# Patient Record
Sex: Male | Born: 1935 | Race: Black or African American | Hispanic: No | Smoking: Never smoker
Health system: Southern US, Community
[De-identification: ages and names within clinical notes are randomized; demographics above are authoritative.]

## PROBLEM LIST (undated history)

## (undated) DIAGNOSIS — D126 Benign neoplasm of colon, unspecified: Secondary | ICD-10-CM

## (undated) DIAGNOSIS — C801 Malignant (primary) neoplasm, unspecified: Secondary | ICD-10-CM

## (undated) DIAGNOSIS — J45909 Unspecified asthma, uncomplicated: Secondary | ICD-10-CM

## (undated) DIAGNOSIS — I10 Essential (primary) hypertension: Secondary | ICD-10-CM

## (undated) HISTORY — PX: TONSILLECTOMY: SUR1361

## (undated) HISTORY — DX: Benign neoplasm of colon, unspecified: D12.6

---

## 1994-10-07 HISTORY — PX: TUMOR REMOVAL: SHX12

## 2001-09-14 ENCOUNTER — Encounter: Payer: Self-pay | Admitting: Internal Medicine

## 2001-09-14 ENCOUNTER — Encounter: Admission: RE | Admit: 2001-09-14 | Discharge: 2001-09-14 | Payer: Self-pay | Admitting: Internal Medicine

## 2002-06-01 ENCOUNTER — Ambulatory Visit (HOSPITAL_COMMUNITY): Admission: RE | Admit: 2002-06-01 | Discharge: 2002-06-01 | Payer: Self-pay | Admitting: *Deleted

## 2002-06-01 ENCOUNTER — Encounter (INDEPENDENT_AMBULATORY_CARE_PROVIDER_SITE_OTHER): Payer: Self-pay | Admitting: *Deleted

## 2002-06-01 ENCOUNTER — Encounter (INDEPENDENT_AMBULATORY_CARE_PROVIDER_SITE_OTHER): Payer: Self-pay

## 2003-06-03 ENCOUNTER — Emergency Department (HOSPITAL_COMMUNITY): Admission: EM | Admit: 2003-06-03 | Discharge: 2003-06-04 | Payer: Self-pay | Admitting: Emergency Medicine

## 2003-06-03 ENCOUNTER — Encounter: Payer: Self-pay | Admitting: Emergency Medicine

## 2003-06-06 ENCOUNTER — Encounter: Payer: Self-pay | Admitting: Internal Medicine

## 2003-06-06 ENCOUNTER — Inpatient Hospital Stay (HOSPITAL_COMMUNITY): Admission: EM | Admit: 2003-06-06 | Discharge: 2003-06-11 | Payer: Self-pay | Admitting: Internal Medicine

## 2003-06-07 ENCOUNTER — Encounter: Payer: Self-pay | Admitting: Oncology

## 2003-06-09 ENCOUNTER — Encounter: Payer: Self-pay | Admitting: Internal Medicine

## 2004-05-17 ENCOUNTER — Ambulatory Visit (HOSPITAL_COMMUNITY): Admission: RE | Admit: 2004-05-17 | Discharge: 2004-05-17 | Payer: Self-pay | Admitting: *Deleted

## 2004-05-17 ENCOUNTER — Encounter (INDEPENDENT_AMBULATORY_CARE_PROVIDER_SITE_OTHER): Payer: Self-pay | Admitting: *Deleted

## 2004-08-28 ENCOUNTER — Ambulatory Visit: Payer: Self-pay | Admitting: Oncology

## 2005-02-21 ENCOUNTER — Ambulatory Visit: Payer: Self-pay | Admitting: Oncology

## 2006-02-14 ENCOUNTER — Ambulatory Visit: Payer: Self-pay | Admitting: Oncology

## 2006-02-18 LAB — CBC & DIFF AND RETIC
Basophils Absolute: 0 10*3/uL (ref 0.0–0.1)
Eosinophils Absolute: 0.3 10*3/uL (ref 0.0–0.5)
HGB: 13.1 g/dL (ref 13.0–17.1)
IRF: 0.42 — ABNORMAL HIGH (ref 0.070–0.380)
MCV: 103.1 fL — ABNORMAL HIGH (ref 81.6–98.0)
MONO#: 0.3 10*3/uL (ref 0.1–0.9)
MONO%: 9.2 % (ref 0.0–13.0)
NEUT#: 1.6 10*3/uL (ref 1.5–6.5)
RBC: 3.69 10*6/uL — ABNORMAL LOW (ref 4.20–5.71)
RDW: 12.9 % (ref 11.2–14.6)
RETIC #: 69 10*3/uL (ref 31.8–103.9)
Retic %: 1.9 % (ref 0.7–2.3)
WBC: 3.3 10*3/uL — ABNORMAL LOW (ref 4.0–10.0)
lymph#: 1.1 10*3/uL (ref 0.9–3.3)

## 2006-02-18 LAB — MORPHOLOGY
PLT EST: ADEQUATE
RBC Comments: NORMAL

## 2007-02-20 ENCOUNTER — Ambulatory Visit: Payer: Self-pay | Admitting: Oncology

## 2007-02-24 LAB — CBC & DIFF AND RETIC
BASO%: 0.7 % (ref 0.0–2.0)
Basophils Absolute: 0 10*3/uL (ref 0.0–0.1)
EOS%: 7.5 % — ABNORMAL HIGH (ref 0.0–7.0)
IRF: 0.48 — ABNORMAL HIGH (ref 0.070–0.380)
MCH: 36.5 pg — ABNORMAL HIGH (ref 28.0–33.4)
MCHC: 35.7 g/dL (ref 32.0–35.9)
MCV: 102.2 fL — ABNORMAL HIGH (ref 81.6–98.0)
MONO%: 7.7 % (ref 0.0–13.0)
RDW: 13.4 % (ref 11.2–14.6)
RETIC #: 86.6 10*3/uL (ref 31.8–103.9)
lymph#: 1 10*3/uL (ref 0.9–3.3)

## 2007-02-24 LAB — MORPHOLOGY

## 2007-02-25 LAB — ANA: Anti Nuclear Antibody(ANA): NEGATIVE

## 2009-06-13 ENCOUNTER — Ambulatory Visit: Payer: Self-pay | Admitting: Gastroenterology

## 2009-06-13 DIAGNOSIS — J45909 Unspecified asthma, uncomplicated: Secondary | ICD-10-CM | POA: Insufficient documentation

## 2009-06-13 DIAGNOSIS — Z860100 Personal history of colon polyps, unspecified: Secondary | ICD-10-CM | POA: Insufficient documentation

## 2009-06-13 DIAGNOSIS — I119 Hypertensive heart disease without heart failure: Secondary | ICD-10-CM | POA: Insufficient documentation

## 2009-06-20 ENCOUNTER — Ambulatory Visit: Payer: Self-pay | Admitting: Gastroenterology

## 2009-06-20 ENCOUNTER — Encounter: Payer: Self-pay | Admitting: Gastroenterology

## 2009-06-26 ENCOUNTER — Encounter: Payer: Self-pay | Admitting: Gastroenterology

## 2011-02-22 NOTE — H&P (Signed)
NAME:  Rodney Elliott, Rodney Elliott                          ACCOUNT NO.:  [id number]   MEDICAL RECORD NO.:  [id number]                   PATIENT TYPE:  INP   LOCATION:  0374                                 FACILITY:  [facility]   PHYSICIAN:  Erskine Speed, M.D.                DATE OF BIRTH:  [birth year]   DATE OF ADMISSION:  06/06/2003  DATE OF DISCHARGE:                                HISTORY & PHYSICAL   HOSPITAL COURSE:  1. Thrombocytopenia and hemolytic anemia:  Subjective:  This 77 year old patient has been well until August 27 when he  felt weak, slightly short of breath, but had no chest pain.  He had mild  nausea and a low grade fever.  He was having some left abdominal pain and  was seen at [facility] and was found to have a marked  thrombocytopenia with platelets of 29,000 and hemolysis and splenomegaly.  CT scan of the abdomen confirmed enlargement of the spleen, but no other  abnormalities.  The patient had elevated bilirubin also but he was sent home  over the weekend.  Over the weekend he was quite weak, nauseated, and was  cared for by his neighbors.  He was seen in the office on June 06, 2003  and admitted to [facility].  At that time blood sugar was 225.   1. Asthma:  Subjective:  The patient has mild asthma with just occasional use of  inhaler.   1. High blood pressure:  Subjective:  The patient is stable on his usual medications, and on Feb 25, 2003 had his complete physical exam.   1. Colon polyps:  Subjective:  The patient has no current problems relevant to this, and  colonoscopy is due August 2005.   PAST HISTORY:  Unrelated.   ALLERGIES:  None known.   FAMILY HISTORY:  Noncontributory.   HOME MEDICATIONS:  1. Cardura 8 mg daily.  2. Albuterol as needed.  3. Allegra 180 mg one daily.  4. Advair 100/50 one daily.   PHYSICAL EXAMINATION:  VITAL SIGNS:  Temperature 100.9, blood pressure  140/90, respirations 16-20 with mild labor,  pulse 120 and regular.  EKG  showed sinus tachycardia.  HEENT:  Arcus senilis was noted.  NECK:  Normal, supple, full range of motion.  There were no masses.  The  thyroid was negative.  There were no bruits.  CHEST:  Harsh breath sounds.  There are decreased breath sounds in the left  base.  CARDIOVASCULAR:  Rate was tachycardia.  S1 and S2 were normal without  gallop, thrill, heave, rub, or murmur.  ABDOMEN:  Soft and flat.  Bowel sounds were normal.  There was no definable  organomegaly or masses.  There was definite tenderness over the spleen.  There is no CVA pain.  There are no abdominal bruits.  GENITOURINARY:  Negative.  RECTAL:  Done on Feb 25, 2003, and not repeated.  SKIN:  Unremarkable for any change.  JOINT SURVEY:  Unremarkable for any change.  NEUROLOGIC:  Unremarkable for any change.  DISTAL CIRCULATION:  Unremarkable for any change.   ASSESSMENT:  Hemolytic anemia, thrombocytopenia, fever, hyperglycemia,  etiology unknown.   PLAN:  Admit for laboratory evaluation, cultures, hematology consultation.  IV fluids.                                               Erskine Speed, M.D.    Balinda Quails  D:  06/10/2003  T:  06/10/2003  Job:  [id number]   cc:   Genene Churn. Cyndie Chime, M.D.  [address] [facility]  [place]  [place] [address]  Fax: [phone]

## 2011-02-22 NOTE — Op Note (Signed)
NAME:  Rodney Elliott, Rodney Elliott NO.:  [id number]   MEDICAL RECORD NO.:  [id number]                   PATIENT TYPE:  AMB   LOCATION:  ENDO                                 FACILITY:  [facility]   PHYSICIAN:  Georgiana Spinner, M.D.                 DATE OF BIRTH:  02/28/1928   DATE OF PROCEDURE:  DATE OF DISCHARGE:                                 OPERATIVE REPORT   PROCEDURE:  Colonoscopy.   SURGEON:   INDICATIONS FOR PROCEDURE:  Cancer screening.   ANESTHESIA:  Demerol 50, Versed 5 mg.   DESCRIPTION OF PROCEDURE:  With the patient mildly sedated in the left  lateral decubitus position, the Olympus videoscopic colonoscope was inserted  in the rectum and passed under direct vision through the colon to the cecum,  identified __________ cecum and ileocecal valve which were photographed.  After clearing the cecum of fecal debris which looked tenacious which  required a lot of liquid to dilute it.  We were able to suction this out and  withdrew the colonoscope taking circumferential views of the colonic mucosa.  We removed the colonoscope all the way to the rectum which appeared showed  hemorrhoids on retroflexed view.  The endoscope was straightened and  withdrawn.  The patient's vital signs and pulse oximeter remained stable.  The patient tolerated the procedure well without apparent complications.   FINDINGS:  Internal hemorrhoids, otherwise unremarkable examination.  Limited somewhat by the tenacity of this fecal material, although we diluted  and suctioned as best as possible.  A small lesion could be missed but no  gross lesions seen certainly.  Will follow up on this gentleman again in  probably five years.                                               Georgiana Spinner, M.D.    GMO/MEDQ  D:  05/17/2004  T:  05/17/2004  Job:  [id number]   cc:   Erskine Speed, M.D.  [address]  [place]  [place] [address]  Fax: [phone]

## 2011-02-22 NOTE — Consult Note (Signed)
NAME:  Rodney Elliott, Rodney Elliott                          ACCOUNT NO.:  [id number]   MEDICAL RECORD NO.:  [id number]                   PATIENT TYPE:  INP   LOCATION:  0374                                 FACILITY:  [facility]   PHYSICIAN:  Genene Rodney Elliott, M.D.          DATE OF BIRTH:  10-29-1927   DATE OF CONSULTATION:  06/07/2003  DATE OF DISCHARGE:                                   CONSULTATION   This is a hematology consultation to evaluate this man for unexplained  anemia and thrombocytopenia.   This is a pleasant [name] who has been in overall  excellent health except for mild treated hypertension and asthma.  He  initially presented to the emergency department on Friday, August 27 with a  24-hour history of vague left upper quadrant abdominal pain and hematuria.  He was evaluated to rule out kidney stones.  A CT scan of the abdomen and  pelvis was done which showed mild splenomegaly,  no adenopathy and no  evidence for any renal stones.  A lab panel was done on August 27 which  showed hemoglobin of 11.7, MCV 100.2, white count 19,000 with 88%  neutrophils, 6 lymphocytes, 6 monocytes and a platelet count of 29,000.  PT  and PTT were both elevated at 19 and 40 seconds respectively and he is not  on any anticoagulation.  He was told to go home and call his internist on  Monday for further evaluation.   Over the weekend, he continued to have symptoms and became weaker, nauseous  and had dyspnea on exertion.  He did call Dr. Chilton Si Monday morning and he  was promptly evaluated and admitted to the hospital.  Repeat laboratory  studies done on August 30 showed a hemoglobin down to 9.3 and patient's  baseline hemoglobin just recorded in May at time of a yearly physical was 14  grams, MCV 104, platelets 78,000, repeat pro time 14.9, PTT 34 seconds.  Fibrinogen elevated at 693.  D-dimer markedly elevated at 13.5.  Haptoglobin  less than 6.  A Monospot was negative.   Coombs direct and indirect  antibodies were negative.  Serum bilirubin was 3.2.  Repeat value 3.6 and  only 0.9 direct with predominant indirect 2.7.  SGOT mildly elevated at 40.  Remainder of liver functions were normal.   A repeat CBC this morning now shows hemoglobin down to 8.6 and platelet  count of 81,000.   Past history is as noted as above.   MEDICATIONS:  1. Cardura.  2. Advair.   He has taken no new medications recently and has no allergies.  No toxic  exposures.  No infectious exposures.   FAMILY HISTORY:  One male sibling died of complications of rheumatic heart  disease.  Another male sibling had sickle cell trait.  Died in 2004/06/28at  age 16, reasons not known.  Autopsy results pending.   SOCIAL HISTORY:  He is divorced.  He is a retired Paramedic.  He has  lived all over the [place]. He has one daughter who is in her 77s  alive and well.   REVIEW OF SYSTEMS:  He noted a cold sore on his lip about a week ago, but no  associated fever, rash or flu-like symptoms.  No fevers.  No cough.  No  headaches.  No slurred speech.   PHYSICAL EXAMINATION:  GENERAL:  Pleasant well-nourished African-American  man in no distress.  VITAL SIGNS:  Temperatures have been up to 101 degrees.  SKIN, HAIR AND NAILS:  Normal without ecchymosis, petechiae or rash.  HEENT:  Pupils are equal and reactive to light.  There is prominent arcus  senilis.  Pharynx:  No erythema or exudate.  NECK:  Supple. No thyromegaly.  No lymphadenopathy in the neck,  supraclavicular, axillary or inguinal regions.  LUNGS:  Decreased breath sounds at the left base which is dull to  percussion, but no egophony.  CARDIAC:  Regular cardiac rhythm with a 2-3/6 apical systolic murmur.  ABDOMEN:  Soft.  I did not palpate a liver or spleen, but he is tender in  the left upper quadrant.  EXTREMITIES:  No edema.  No calf tenderness.  NEUROLOGIC:  Mental status intact.  Cranial nerves intact.  Motor  strength  5/5.  Reflexes 2+ symmetric.  Coordination is normal.   REVIEW OF PERIPHERAL BLOOD FILM:  There are 2+ spherocytes, but no  schistocytes.  Platelets are confirmed decreased only 1-3 high powered  field.  The neutrophils and lymphocytes appear mature and normal.   SUMMARY AND IMPRESSION:  Otherwise, healthy 77 year old man presents with  acute nonimmune hemolytic anemia with associated thrombocytopenia.  There is  a questionable early infiltrate in the left lower lung, but the patient has  no respiratory symptoms.   In the absence of schistocytes on the peripheral blood film, one cannot make  a diagnosis of TTP.  The only thing that fits the clinical presentation with  initial elevation of his PT and PTT and the D-dimer is an atypical infection  which then provoked a systemic reaction resulting in nonimmune hemolytic  anemia and thrombocytopenia. This could be a viral or other pathogen such as  mycoplasma.   His hematologic profile seems to be stabilizing on its own with a rise in  his platelet count over the last 48 hours.  However, hemoglobin is still  falling.   RECOMMENDATIONS:  I will check mycoplasma titers, toxoplasmosis titers,  Epstein-Barr virus and herpes simplex virus titers.  I would repeat a chest  x-ray.  I would begin him on azithromycin pending results of the above.  I  would continue monitor daily CBCs and retic counts along with LDH to assess  for ongoing hemolysis.  Despite the fact that the Coombs test was negative,  I would still consider a trial of steroids if hemoglobin continues to fall.   Thank you for this consultation.  I will follow with interest with you.                                               Genene Rodney Elliott, M.D.    Lottie Rater  D:  06/07/2003  T:  06/07/2003  Job:  [id number]   cc:   Erskine Speed, M.D.  [address]  [address]  [place]  [place] [address]  Fax: [phone]

## 2011-02-22 NOTE — Discharge Summary (Signed)
NAME:  Rodney Elliott, Rodney Elliott                          ACCOUNT NO.:  [id number]   MEDICAL RECORD NO.:  [id number]                   PATIENT TYPE:  INP   LOCATION:  0374                                 FACILITY:  [facility]   PHYSICIAN:  Erskine Speed, M.D.                DATE OF BIRTH:  [birth year]   DATE OF ADMISSION:  06/06/2003  DATE OF DISCHARGE:  06/11/2003                                 DISCHARGE SUMMARY   PROBLEM LIST:  1. Thrombocytopenia.  2. Hemolytic anemia.  3. Fever.   SUBJECTIVE:  This 77 year old patient has generally been in good health and  has been treated for high blood pressure and asthma.  He became ill with  fever, weakness, marked thrombocytopenia with platelet count of 29,000,  hemolysis, and splenomegaly, and was admitted to the hospital on June 06, 2003 for further evaluation.  On admission to the hospital, extensive  laboratory testing was done (see lab cumulative) without an initial  diagnosis.  Because of the patient's fever, cultures were obtained from  multiple sites.  The patient's hemoglobin continued to fall from its normal  range down to 8.6 with a white count of 11,600.  Platelets increased in the  first 24 hours up to 81,000.  Accu-Cheks were still slightly high, but  hemoglobin A1C was 4.3, and the patient had no previous history of diabetes.  A haptoglobin test was less than 6.  Coombs test was negative.  The patient  was evaluated for DIC, and it was felt not to be the case.  On June 07, 2003, the patient was seen by Dr. Riley Churches in consultation.  In  addition to the previously-ordered studies, other tests for viral and  certain bacterial infections were ordered by Dr. Cyndie Chime.  By June 08, 2003, the patient was still having some slight nausea, the spleen was  still slightly tender, temperature was 100.8 over the prior 24 hours.  Bilirubin was 3.9.  Liver function tests were now normal.  LDH continued to  be elevated at 793.   Screens for toxoplasmosis and other infections were all  negative.  At that point, the patient had a hemolytic anemia, etiology  unknown, and was on IV antibiotics (Zithromax).  He was also evaluated for  HIV and Epstein-Barr virus.  By June 09, 2003, the platelet count was up  to 166,000, and hemoglobin was leveling at 8.7.  Bilirubin was down from 3.9  to 1.5.  LDH continued to be over 700.  By June 10, 2003, the patient  felt great.  He was tolerating a regular diet.  Hemoglobin was stable at  8.8, white count was 11,700, and other laboratories were basically  unchanged.  Chest x-ray had shown improvement from its initial minor  irregularities.  The patient was changed from IV to p.o. antibiotics.  By  June 11, 2003, the patient felt great, had ambulated  in the hall all  day, and had remained afebrile and was ready for discharge.   DISCHARGE DIAGNOSIS:  Hemolytic anemia, etiology unknown.   DISCHARGE CONDITION:  Improved.   CONSULTATIONS:  Dr. Riley Churches.   PROCEDURES:  None.   COMPLICATIONS:  None.   DISCHARGE MEDICATIONS:  1. Folic acid 1 mg twice day.  2. Cardura 8 mg one daily.  3. Potassium chloride one daily.  4. Albuterol as needed.  5. Advair as needed.  6. Allegra as needed.  7. Pain management - Tylenol only.   ACTIVITY:  As tolerated, but try to take it easy.   DIET:  No restrictions.   WOUND CARE:  Not applicable.   SPECIAL INSTRUCTIONS:  1. See Dr. Erskine Speed in one month.  2. Call Dr. Riley Churches for appointment to be scheduled within the next     week or two for further laboratory follow up.                                               Erskine Speed, M.D.    Balinda Quails  D:  07/07/2003  T:  07/07/2003  Job:  [id number]   cc:   Genene Churn. Cyndie Chime, M.D.  [address] [facility]  [place]  [place] [address]  Fax: [phone]

## 2011-02-22 NOTE — Op Note (Signed)
   NAME:  Rodney Elliott, Rodney Elliott                          ACCOUNT NO.:  [id number]   MEDICAL RECORD NO.:  [id number]                   PATIENT TYPE:  AMB   LOCATION:  ENDO                                 FACILITY:  [facility]   PHYSICIAN:  Georgiana Spinner, M.D.                 DATE OF BIRTH:  08-07-1928   DATE OF PROCEDURE:  06/01/2002  DATE OF DISCHARGE:                                 OPERATIVE REPORT   PROCEDURE:  Colonoscopy with polypectomy.   INDICATIONS:  Colon polyp.  Colon cancer screening.   ANESTHESIA:  Demerol 70 and Versed 7 mg.   PROCEDURE:  With the patient mildly sedated in the left lateral decubitus  position, a rectal examination was performed and he had hemorrhoidal tags  noted.  The prostate was normal, otherwise unremarkable rectal examination.  Subsequently the Olympus videoscopic colonoscope was inserted into the  rectum and passed under direct vision into the cecum, identified by the  ileocecal valve and appendiceal orifice.  The prep was Viscol tablets and  the preparation was good.  From this point the colonoscope was slowly  withdrawn taking circumferential views of the entire colonic mucosa,  stopping in the sigmoid colon approximately 40 cm from the anal verge, at  which point a small polyp was seen.  It was photographed and it was removed  using snare cautery technique on a setting of 20/20 blended current.  It was  approximately 3 to 4 mm in size.  The endoscope was then withdrawn all the  way to the rectum which appeared normal on direct and showed hemorrhoids on  retroflex view.  The endoscope was straightened and withdrawn.  The patient  vital signs and pulse oximeter remained stable.  The patient tolerated the  procedure well without apparent complications.   FINDINGS:  1. Occasional diverticulum of the sigmoid colon.  2. Small polyp at 40 cm, removed by snare cautery technique.  3. Internal hemorrhoids.  4. Otherwise an unremarkable colonoscopic  examination.   PLAN:  Await biopsy report of the polyp.  The patient will call me for  results and followup with me as needed as an outpatient.                                               Georgiana Spinner, M.D.    GMO/MEDQ  D:  06/01/2002  T:  06/03/2002  Job:  [id number]   cc:   Kingsley Spittle, M.D.

## 2014-04-04 ENCOUNTER — Encounter: Payer: Self-pay | Admitting: *Deleted

## 2014-04-15 ENCOUNTER — Encounter: Payer: Self-pay | Admitting: Gastroenterology

## 2015-10-24 ENCOUNTER — Other Ambulatory Visit: Payer: Self-pay | Admitting: Internal Medicine

## 2015-10-24 DIAGNOSIS — N2889 Other specified disorders of kidney and ureter: Secondary | ICD-10-CM

## 2015-10-26 ENCOUNTER — Ambulatory Visit
Admission: RE | Admit: 2015-10-26 | Discharge: 2015-10-26 | Disposition: A | Discharge status: Home / Self Care | Payer: Medicare Other | Source: Ambulatory Visit | Attending: Internal Medicine | Admitting: Internal Medicine

## 2015-10-26 DIAGNOSIS — N2889 Other specified disorders of kidney and ureter: Secondary | ICD-10-CM

## 2016-02-26 ENCOUNTER — Encounter: Payer: Self-pay | Admitting: Gastroenterology

## 2016-03-25 ENCOUNTER — Telehealth: Payer: Self-pay | Admitting: Oncology

## 2016-03-25 ENCOUNTER — Encounter: Payer: Self-pay | Admitting: Oncology

## 2016-03-25 NOTE — Telephone Encounter (Signed)
Contacted pt regarding hem appt for 04/12/16. Completed intake, mailed new pt letter.

## 2016-04-12 ENCOUNTER — Other Ambulatory Visit: Payer: Self-pay | Admitting: *Deleted

## 2016-04-12 ENCOUNTER — Ambulatory Visit (HOSPITAL_BASED_OUTPATIENT_CLINIC_OR_DEPARTMENT_OTHER): Payer: Medicare Other | Admitting: Oncology

## 2016-04-12 ENCOUNTER — Ambulatory Visit (HOSPITAL_BASED_OUTPATIENT_CLINIC_OR_DEPARTMENT_OTHER): Payer: Medicare Other

## 2016-04-12 VITALS — BP 145/83 | HR 73 | Temp 97.8°F | Resp 18 | Ht 68.0 in | Wt 162.5 lb

## 2016-04-12 DIAGNOSIS — D472 Monoclonal gammopathy: Secondary | ICD-10-CM

## 2016-04-12 DIAGNOSIS — D72819 Decreased white blood cell count, unspecified: Secondary | ICD-10-CM | POA: Diagnosis not present

## 2016-04-12 DIAGNOSIS — N289 Disorder of kidney and ureter, unspecified: Secondary | ICD-10-CM

## 2016-04-12 DIAGNOSIS — D649 Anemia, unspecified: Secondary | ICD-10-CM

## 2016-04-12 LAB — CBC WITH DIFFERENTIAL/PLATELET
BASO%: 0.8 % (ref 0.0–2.0)
Basophils Absolute: 0 10*3/uL (ref 0.0–0.1)
EOS%: 3.6 % (ref 0.0–7.0)
Eosinophils Absolute: 0.2 10*3/uL (ref 0.0–0.5)
HCT: 33.2 % — ABNORMAL LOW (ref 38.4–49.9)
HGB: 10.9 g/dL — ABNORMAL LOW (ref 13.0–17.1)
LYMPH#: 1 10*3/uL (ref 0.9–3.3)
LYMPH%: 23.7 % (ref 14.0–49.0)
MCH: 34.6 pg — ABNORMAL HIGH (ref 27.2–33.4)
MCHC: 32.8 g/dL (ref 32.0–36.0)
MCV: 105.4 fL — ABNORMAL HIGH (ref 79.3–98.0)
MONO#: 0.2 10*3/uL (ref 0.1–0.9)
MONO%: 5.7 % (ref 0.0–14.0)
NEUT%: 66.2 % (ref 39.0–75.0)
NEUTROS ABS: 2.8 10*3/uL (ref 1.5–6.5)
PLATELETS: 207 10*3/uL (ref 140–400)
RBC: 3.14 10*6/uL — AB (ref 4.20–5.82)
RDW: 13.2 % (ref 11.0–14.6)
WBC: 4.3 10*3/uL (ref 4.0–10.3)

## 2016-04-12 LAB — COMPREHENSIVE METABOLIC PANEL
ALT: 16 U/L (ref 0–55)
ANION GAP: 7 meq/L (ref 3–11)
AST: 21 U/L (ref 5–34)
Albumin: 3.8 g/dL (ref 3.5–5.0)
Alkaline Phosphatase: 75 U/L (ref 40–150)
BILIRUBIN TOTAL: 0.66 mg/dL (ref 0.20–1.20)
BUN: 10.5 mg/dL (ref 7.0–26.0)
CO2: 22 meq/L (ref 22–29)
CREATININE: 2.2 mg/dL — AB (ref 0.7–1.3)
Calcium: 9.9 mg/dL (ref 8.4–10.4)
Chloride: 113 mEq/L — ABNORMAL HIGH (ref 98–109)
EGFR: 31 mL/min/{1.73_m2} — ABNORMAL LOW (ref >90)
GLUCOSE: 120 mg/dL (ref 70–140)
Potassium: 4.9 mEq/L (ref 3.5–5.1)
Sodium: 142 mEq/L (ref 136–145)
TOTAL PROTEIN: 7.3 g/dL (ref 6.4–8.3)

## 2016-04-12 LAB — RETICULOCYTES (CHCC)
Immature Retic Fract: 10.3 % (ref 3.00–10.60)
RBC: 3.09 10*6/uL — ABNORMAL LOW (ref 4.20–5.82)
Retic %: 2.87 % — ABNORMAL HIGH (ref 0.80–1.80)
Retic Ct Abs: 88.68 10*3/uL (ref 34.80–93.90)

## 2016-04-12 LAB — CHCC SMEAR

## 2016-04-12 NOTE — Progress Notes (Signed)
[facility] New Patient Consult   Referring MD: Levin Erp, Md [address], [place], [place] [address]   Rodney Elliott 82 y.o.  1928/06/15    Reason for Referral: Renal insufficiency, abnormal serum protein, anemia   HPI: Rodney Elliott is followed by Dr. Nyoka Cowden for internal medicine care. He has a history of hypertension and remote hemolytic anemia. He saw Dr. Nyoka Cowden on 03/06/2016. A CBC found hemoglobin 11.6, MCV 103.9, white count 3.3, ANC 1.9. The creatinine returned at 2.2 with a total protein of 7, albumin 4.1, and calcium of 9.8. The erythrocyte sedimentation rate returned at 5. A serum protein electrophoresis revealed a 0.6 g/dL abnormal protein band. 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  Rodney Elliott reports feeling well. No complaints.  On 10/19/2015 the hemoglobin returned at 11.1, MCV 105.7, platelets 150,000, white count 3.6 with an ANC of 1.9. The creatinine returned at 1.91 in September 2016.    Past Medical History  Diagnosis Date  . Tubular adenoma of colon     2010    .   Hypertension   .   Asthma   .   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   Mild chronic leukopenia  Past surgical history: Removal of a benign lesion from the left neck in 1996   Medications: Reviewed  Allergies: No Known Allergies  Family history: No family history of cancer. He has one daughter and had 2 brothers.  Social History:   He lives alone in [place]. He is retired from the [employer]. He does not use cigarettes. He drinks alcohol occasionally. No transfusion history. No risk factor for HIV or hepatitis.    ROS:   Positives include: None  A complete ROS was otherwise negative.  Physical Exam:  Blood pressure 145/83, pulse 73, temperature 97.8 F (36.6 C), temperature source Oral, resp. rate 18, height '5\' 8"'  (1.727 m), weight 162 lb 8 oz (73.71 kg), SpO2 100 %.  HEENT: Oropharynx without  visible mass, neck without mass Lungs: Clear bilaterally, no respiratory distress Cardiac: Regular rate and rhythm Abdomen: No hepatosplenomegaly, nontender, no mass, reducible left inguinal hernia GU: Uncircumcised male, testes without mass  Vascular: No leg edema Lymph nodes: No cervical, supraclavicular, axillary, or inguinal nodes Neurologic: Alert and oriented, the motor exam appears intact in the upper and lower extremities Skin: No rash   LAB:  CBC  Lab Results  Component Value Date   WBC 4.3 04/12/2016   HGB 10.9* 04/12/2016   HCT 33.2* 04/12/2016   MCV 105.4* 04/12/2016   PLT 207 04/12/2016   NEUTROABS 2.8 04/12/2016    Blood smear: The platelets are normal in number, no platelet clumps. I saw one 5 lobed neutrophil. No blasts. A few ovalocytes and teardrops. The polychromasia is not increased. No spherocytes.   Assessment/Plan:   1. Macrocytic anemia  2. Renal insufficiency  3.   Serum monoclonal IgG kappa protein  4.    Hypertension  5.    Idiopathic nonimmune hemolytic anemia/thrombocytopenia in 2014, spontaneously resolved   Disposition:   Rodney Elliott is referred for hematology evaluation with a recently discovered serum monoclonal protein. He has a history of chronic macrocytic anemia and renal insufficiency. He has a remote history of hemolytic anemia, but the blood smear today is not suggestive of ongoing hemolysis.  I have a low clinical suspicion for multiple myeloma. We will obtain quantitative immunoglobulin levels and a serum free light chain analysis. We will  proceed with additional diagnostic evaluation such as a metastatic bone survey or bone marrow biopsy pending these results.  The macrocytic anemia may be related to myelodysplasia. We will obtain a reticulocyte count and haptoglobin level to look for evidence of ongoing hemolysis. The anemia may be in part related to chronic renal insufficiency. I will check a vitamin B 12 level.  We will ask  Rodney Elliott to return for an office visit and repeat CBC in 3-4 months. We will see him sooner pending the above laboratory evaluation.    Betsy Coder, MD  04/12/2016, 10:52 AM

## 2016-04-13 LAB — HAPTOGLOBIN: Haptoglobin: <10 mg/dL — ABNORMAL LOW (ref 34–200)

## 2016-04-15 ENCOUNTER — Telehealth: Payer: Self-pay | Admitting: Oncology

## 2016-04-15 LAB — KAPPA/LAMBDA LIGHT CHAINS
Ig Kappa Free Light Chain: 216.5 mg/L — ABNORMAL HIGH (ref 3.3–19.4)
Ig Lambda Free Light Chain: 28.2 mg/L — ABNORMAL HIGH (ref 5.7–26.3)
KAPPA/LAMBDA FLC RATIO: 7.68 — AB (ref 0.26–1.65)

## 2016-04-15 LAB — DIRECT ANTIGLOBULIN TEST (NOT AT ARMC): COOMBS', DIRECT: NEGATIVE

## 2016-04-15 LAB — VITAMIN B12: Vitamin B12: 434 pg/mL (ref 211–946)

## 2016-04-15 NOTE — Telephone Encounter (Signed)
cld & spoke to pt and adv pt of appt time & date 10/10@10 -pt stated was told not to return-adv per pof to sch this appt-pt understood

## 2016-04-17 LAB — MULTIPLE MYELOMA PANEL, SERUM
Albumin SerPl Elph-Mcnc: 3.7 g/dL (ref 2.9–4.4)
Albumin/Glob SerPl: 1.2 (ref 0.7–1.7)
Alpha 1: 0.2 g/dL (ref 0.0–0.4)
Alpha2 Glob SerPl Elph-Mcnc: 0.5 g/dL (ref 0.4–1.0)
B-GLOBULIN SERPL ELPH-MCNC: 1.2 g/dL (ref 0.7–1.3)
Gamma Glob SerPl Elph-Mcnc: 1.2 g/dL (ref 0.4–1.8)
Globulin, Total: 3.1 g/dL (ref 2.2–3.9)
IGA/IMMUNOGLOBULIN A, SERUM: 536 mg/dL — AB (ref 61–437)
IGM (IMMUNOGLOBIN M), SRM: 59 mg/dL (ref 15–143)
IgG, Qn, Serum: 1286 mg/dL (ref 700–1600)
M Protein SerPl Elph-Mcnc: 0.7 g/dL — ABNORMAL HIGH
TOTAL PROTEIN: 6.8 g/dL (ref 6.0–8.5)

## 2016-05-01 ENCOUNTER — Other Ambulatory Visit: Payer: Self-pay | Admitting: *Deleted

## 2016-05-01 ENCOUNTER — Telehealth: Payer: Self-pay | Admitting: *Deleted

## 2016-05-01 DIAGNOSIS — D472 Monoclonal gammopathy: Secondary | ICD-10-CM

## 2016-05-01 NOTE — Telephone Encounter (Signed)
Labs forwarded to Dr. Nyoka Cowden. Order to schedulers requesting they contact pt to move lab to one week before office visit.

## 2016-05-03 ENCOUNTER — Telehealth: Payer: Self-pay | Admitting: Oncology

## 2016-05-03 NOTE — Telephone Encounter (Signed)
spoke w/ pt confirmed oct apt dates & times

## 2016-07-09 ENCOUNTER — Other Ambulatory Visit: Payer: Medicare Other

## 2016-07-10 ENCOUNTER — Other Ambulatory Visit (HOSPITAL_BASED_OUTPATIENT_CLINIC_OR_DEPARTMENT_OTHER): Payer: Medicare Other

## 2016-07-10 DIAGNOSIS — D472 Monoclonal gammopathy: Secondary | ICD-10-CM

## 2016-07-10 LAB — CBC WITH DIFFERENTIAL/PLATELET
BASO%: 1 % (ref 0.0–2.0)
Basophils Absolute: 0 10*3/uL (ref 0.0–0.1)
EOS ABS: 0.2 10*3/uL (ref 0.0–0.5)
EOS%: 6 % (ref 0.0–7.0)
HCT: 33.3 % — ABNORMAL LOW (ref 38.4–49.9)
HGB: 11.2 g/dL — ABNORMAL LOW (ref 13.0–17.1)
LYMPH%: 25.6 % (ref 14.0–49.0)
MCH: 34.9 pg — ABNORMAL HIGH (ref 27.2–33.4)
MCHC: 33.5 g/dL (ref 32.0–36.0)
MCV: 104 fL — ABNORMAL HIGH (ref 79.3–98.0)
MONO#: 0.2 10*3/uL (ref 0.1–0.9)
MONO%: 6.6 % (ref 0.0–14.0)
NEUT%: 60.8 % (ref 39.0–75.0)
NEUTROS ABS: 2.1 10*3/uL (ref 1.5–6.5)
NRBC: 0 % (ref 0–0)
PLATELETS: 147 10*3/uL (ref 140–400)
RBC: 3.2 10*6/uL — AB (ref 4.20–5.82)
RDW: 12.8 % (ref 11.0–14.6)
WBC: 3.5 10*3/uL — AB (ref 4.0–10.3)
lymph#: 0.9 10*3/uL (ref 0.9–3.3)

## 2016-07-10 LAB — COMPREHENSIVE METABOLIC PANEL
ALT: 14 U/L (ref 0–55)
ANION GAP: 8 meq/L (ref 3–11)
AST: 29 U/L (ref 5–34)
Albumin: 3.8 g/dL (ref 3.5–5.0)
Alkaline Phosphatase: 66 U/L (ref 40–150)
BILIRUBIN TOTAL: 1.29 mg/dL — AB (ref 0.20–1.20)
BUN: 17.9 mg/dL (ref 7.0–26.0)
CHLORIDE: 111 meq/L — AB (ref 98–109)
CO2: 22 meq/L (ref 22–29)
CREATININE: 2.4 mg/dL — AB (ref 0.7–1.3)
Calcium: 10 mg/dL (ref 8.4–10.4)
EGFR: 27 mL/min/{1.73_m2} — ABNORMAL LOW (ref >90)
GLUCOSE: 134 mg/dL (ref 70–140)
Potassium: 4.7 mEq/L (ref 3.5–5.1)
Sodium: 141 mEq/L (ref 136–145)
TOTAL PROTEIN: 7.3 g/dL (ref 6.4–8.3)

## 2016-07-11 LAB — KAPPA/LAMBDA LIGHT CHAINS
IG KAPPA FREE LIGHT CHAIN: 216.4 mg/L — AB (ref 3.3–19.4)
Ig Lambda Free Light Chain: 29.2 mg/L — ABNORMAL HIGH (ref 5.7–26.3)
Kappa/Lambda FluidC Ratio: 7.41 — ABNORMAL HIGH (ref 0.26–1.65)

## 2016-07-12 LAB — PROTEIN ELECTROPHORESIS, SERUM
A/G Ratio: 1.2 (ref 0.7–1.7)
ALPHA 1: 0.2 g/dL (ref 0.0–0.4)
ALPHA 2: 0.5 g/dL (ref 0.4–1.0)
Albumin: 3.8 g/dL (ref 2.9–4.4)
BETA: 1.2 g/dL (ref 0.7–1.3)
GAMMA GLOBULIN: 1.2 g/dL (ref 0.4–1.8)
GLOBULIN, TOTAL: 3.1 g/dL (ref 2.2–3.9)
M-SPIKE, %: 0.6 g/dL — AB
TOTAL PROTEIN: 6.9 g/dL (ref 6.0–8.5)

## 2016-07-16 ENCOUNTER — Telehealth: Payer: Self-pay | Admitting: Oncology

## 2016-07-16 ENCOUNTER — Other Ambulatory Visit: Payer: Medicare Other

## 2016-07-16 ENCOUNTER — Ambulatory Visit (HOSPITAL_BASED_OUTPATIENT_CLINIC_OR_DEPARTMENT_OTHER): Payer: Medicare Other | Admitting: Oncology

## 2016-07-16 VITALS — BP 147/79 | HR 80 | Temp 97.7°F | Resp 16 | Ht 68.0 in | Wt 163.2 lb

## 2016-07-16 DIAGNOSIS — D472 Monoclonal gammopathy: Secondary | ICD-10-CM | POA: Diagnosis not present

## 2016-07-16 DIAGNOSIS — D649 Anemia, unspecified: Secondary | ICD-10-CM

## 2016-07-16 DIAGNOSIS — N289 Disorder of kidney and ureter, unspecified: Secondary | ICD-10-CM

## 2016-07-16 NOTE — Telephone Encounter (Signed)
GAVE PATIENT AVS REPORT AND APPOINTMENTS FOR November.  °

## 2016-07-16 NOTE — Progress Notes (Signed)
  [facility] OFFICE PROGRESS NOTE   Diagnosis: Anemia, serum monoclonal protein  INTERVAL HISTORY:   Mr. Longman returns as scheduled. He feels well. His only complaint is occasional discomfort in the right thigh that he relates to taking potassium. No other pain.  Objective:  Vital signs in last 24 hours:  Blood pressure (!) 147/79, pulse 80, temperature 97.7 F (36.5 C), temperature source Oral, resp. rate 16, height '5\' 8"'$  (1.727 m), weight 163 lb 3.2 oz (74 kg), SpO2 100 %.    Lymphatics: No cervical, supraclavicular, or axillary nodes Resp: Lungs clear bilaterally Cardio: Regular rate and rhythm GI: No hepatosplenomegaly Vascular: No leg edema   Lab Results:  Lab Results  Component Value Date   WBC 3.5 (L) 07/10/2016   HGB 11.2 (L) 07/10/2016   HCT 33.3 (L) 07/10/2016   MCV 104.0 (H) 07/10/2016   PLT 147 07/10/2016   NEUTROABS 2.1 07/10/2016  Potassium 4.7, BUN 17.9, creatinine 2.4, calcium 10, albumin 3.8, bilirubin 1.29  Serum M spike 0.6, kappa free light chains 21.6, lambda free light chains 2.9  04/12/2016: Haptoglobin less than 10, direct [place] negative IgG 1286, IgA 536, IgM 59 B 09/09/1931  Medications: I have reviewed the patient's current medications.  Assessment/Plan: 1. Macrocytic anemia  2. Renal insufficiency  3.   Serum monoclonal IgG kappa protein  4.    Hypertension  5.    Idiopathic nonimmune hemolytic anemia/thrombocytopenia in 2014, spontaneously resolved    Disposition:  Mr. Gaertner is stable from a hematologic standpoint. He has stable mild macrocytic anemia. I suspect the anemia is related to low-grade hemolysis based on his history, the elevated MCV, low haptoglobin, and elevated bilirubin. The differential diagnosis includes myelodysplasia.  He has a low level serum M spike that has not changed over the past several months. The IgM and IgA levels are not suppressed. I have a low clinical suspicion for multiple  myeloma.  We discussed the need for additional diagnostic evaluation. I decided against a bone marrow biopsy for now. I doubt the renal insufficiency is related to the serum M spike, but we will need to consider a bone marrow biopsy to rule out myeloma if the creatinine rises.  Mr. Lafoe will return for an office and lab visit at a six-week interval.  Betsy Coder, MD  07/16/2016  10:44 AM

## 2016-08-20 ENCOUNTER — Ambulatory Visit (HOSPITAL_BASED_OUTPATIENT_CLINIC_OR_DEPARTMENT_OTHER): Payer: Medicare Other | Admitting: Oncology

## 2016-08-20 ENCOUNTER — Telehealth: Payer: Self-pay | Admitting: Oncology

## 2016-08-20 ENCOUNTER — Other Ambulatory Visit (HOSPITAL_BASED_OUTPATIENT_CLINIC_OR_DEPARTMENT_OTHER): Payer: Medicare Other

## 2016-08-20 VITALS — BP 132/71 | HR 80 | Temp 97.6°F | Resp 16 | Ht 68.0 in | Wt 162.0 lb

## 2016-08-20 DIAGNOSIS — D472 Monoclonal gammopathy: Secondary | ICD-10-CM | POA: Diagnosis not present

## 2016-08-20 DIAGNOSIS — D594 Other nonautoimmune hemolytic anemias: Secondary | ICD-10-CM

## 2016-08-20 DIAGNOSIS — N289 Disorder of kidney and ureter, unspecified: Secondary | ICD-10-CM

## 2016-08-20 DIAGNOSIS — D539 Nutritional anemia, unspecified: Secondary | ICD-10-CM | POA: Diagnosis present

## 2016-08-20 DIAGNOSIS — D649 Anemia, unspecified: Secondary | ICD-10-CM | POA: Diagnosis present

## 2016-08-20 LAB — COMPREHENSIVE METABOLIC PANEL
ALT: 15 U/L (ref 0–55)
ANION GAP: 11 meq/L (ref 3–11)
AST: 22 U/L (ref 5–34)
Albumin: 3.7 g/dL (ref 3.5–5.0)
Alkaline Phosphatase: 76 U/L (ref 40–150)
BUN: 12 mg/dL (ref 7.0–26.0)
CALCIUM: 10.1 mg/dL (ref 8.4–10.4)
CHLORIDE: 112 meq/L — AB (ref 98–109)
CO2: 20 meq/L — AB (ref 22–29)
CREATININE: 2.4 mg/dL — AB (ref 0.7–1.3)
EGFR: 27 mL/min/{1.73_m2} — AB (ref >90)
Glucose: 171 mg/dl — ABNORMAL HIGH (ref 70–140)
Potassium: 4.6 mEq/L (ref 3.5–5.1)
Sodium: 143 mEq/L (ref 136–145)
Total Bilirubin: 0.84 mg/dL (ref 0.20–1.20)
Total Protein: 7.1 g/dL (ref 6.4–8.3)

## 2016-08-20 LAB — CBC & DIFF AND RETIC
BASO%: 0.6 % (ref 0.0–2.0)
Basophils Absolute: 0 10*3/uL (ref 0.0–0.1)
EOS ABS: 0.3 10*3/uL (ref 0.0–0.5)
EOS%: 8.1 % — ABNORMAL HIGH (ref 0.0–7.0)
HCT: 31.5 % — ABNORMAL LOW (ref 38.4–49.9)
HEMOGLOBIN: 10.7 g/dL — AB (ref 13.0–17.1)
IMMATURE RETIC FRACT: 13 % — AB (ref 3.00–10.60)
LYMPH%: 36.2 % (ref 14.0–49.0)
MCH: 34.7 pg — AB (ref 27.2–33.4)
MCHC: 34 g/dL (ref 32.0–36.0)
MCV: 102.3 fL — AB (ref 79.3–98.0)
MONO#: 0.2 10*3/uL (ref 0.1–0.9)
MONO%: 6.7 % (ref 0.0–14.0)
NEUT%: 48.4 % (ref 39.0–75.0)
NEUTROS ABS: 1.7 10*3/uL (ref 1.5–6.5)
Platelets: 130 10*3/uL — ABNORMAL LOW (ref 140–400)
RBC: 3.08 10*6/uL — AB (ref 4.20–5.82)
RDW: 12.7 % (ref 11.0–14.6)
RETIC %: 2.45 % — AB (ref 0.80–1.80)
Retic Ct Abs: 75.46 10*3/uL (ref 34.80–93.90)
WBC: 3.6 10*3/uL — AB (ref 4.0–10.3)
lymph#: 1.3 10*3/uL (ref 0.9–3.3)

## 2016-08-20 LAB — MORPHOLOGY: PLT EST: DECREASED

## 2016-08-20 LAB — LACTATE DEHYDROGENASE: LDH: 198 U/L (ref 125–245)

## 2016-08-20 LAB — CHCC SMEAR

## 2016-08-20 NOTE — Telephone Encounter (Signed)
Appointments scheduled per 11/14 LOS. Patient given AVS report and calendars with future scheduled appointments. ° °

## 2016-08-20 NOTE — Progress Notes (Signed)
  [facility] OFFICE PROGRESS NOTE   Diagnosis: Serum monoclonal protein, anemia  INTERVAL HISTORY:   [name] returns as scheduled. He feels well. He has a "fever blister "at the lip. No other complaint. He reports developing fever blisters in the fall and spring.  Objective:  Vital signs in last 24 hours:  Blood pressure 132/71, pulse 80, temperature 97.6 F (36.4 C), temperature source Oral, resp. rate 16, height '5\' 8"'$  (1.727 m), weight 162 lb (73.5 kg), SpO2 100 %.   Resp: Lungs clear bilaterally Cardio: Regular rate and rhythm GI: No hepatosplenomegaly Vascular: No leg edema  Lab Results:  Lab Results  Component Value Date   WBC 3.6 (L) 08/20/2016   HGB 10.7 (L) 08/20/2016   HCT 31.5 (L) 08/20/2016   MCV 102.3 (H) 08/20/2016   PLT 130 (L) 08/20/2016   NEUTROABS 1.7 08/20/2016  Potassium 4.6, BUN 12, creatinine 2.4, calcium 10.1, albumin 3.7 LDH 198 Bilirubin 0.84 Reticulocyte count 2.45% (75.46)   Medications: I have reviewed the patient's current medications.  Assessment/Plan: 1. Macrocytic anemia  2. Renal insufficiency  3. Serum monoclonal IgG kappa protein  4. Hypertension  5. Idiopathic nonimmune hemolytic anemia/thrombocytopenia in 2014, spontaneously resolved   Disposition:  [name] appears stable. He has chronic mild macrocytic anemia, likely secondary to low level hemolysis. The anemia may also be in part related to renal insufficiency. He has mild leukopenia/thrombocytopenia which may be related to hypersplenism.  [name] has a serum monoclonal protein of unknown significance. No clinical evidence for progression to multiple myeloma.  We decided to continue observation for now. He will return for an office and lab visit in 2 months. We will plan for a bone marrow biopsy develops progressive cytopenias or a rise in the serum M spike.  Betsy Coder, MD  08/20/2016  10:54 AM

## 2016-10-22 ENCOUNTER — Other Ambulatory Visit (HOSPITAL_BASED_OUTPATIENT_CLINIC_OR_DEPARTMENT_OTHER): Payer: Medicare Other

## 2016-10-22 ENCOUNTER — Ambulatory Visit (HOSPITAL_BASED_OUTPATIENT_CLINIC_OR_DEPARTMENT_OTHER): Payer: Medicare Other | Admitting: Oncology

## 2016-10-22 ENCOUNTER — Telehealth: Payer: Self-pay | Admitting: Oncology

## 2016-10-22 VITALS — BP 143/70 | HR 79 | Temp 98.0°F | Resp 18 | Ht 68.0 in | Wt 158.4 lb

## 2016-10-22 DIAGNOSIS — N289 Disorder of kidney and ureter, unspecified: Secondary | ICD-10-CM

## 2016-10-22 DIAGNOSIS — D6489 Other specified anemias: Secondary | ICD-10-CM | POA: Diagnosis present

## 2016-10-22 DIAGNOSIS — D594 Other nonautoimmune hemolytic anemias: Secondary | ICD-10-CM

## 2016-10-22 LAB — COMPREHENSIVE METABOLIC PANEL
ALT: 11 U/L (ref 0–55)
AST: 22 U/L (ref 5–34)
Albumin: 3.8 g/dL (ref 3.5–5.0)
Alkaline Phosphatase: 74 U/L (ref 40–150)
Anion Gap: 7 mEq/L (ref 3–11)
BUN: 16.5 mg/dL (ref 7.0–26.0)
CO2: 23 meq/L (ref 22–29)
CREATININE: 2.4 mg/dL — AB (ref 0.7–1.3)
Calcium: 10 mg/dL (ref 8.4–10.4)
Chloride: 112 mEq/L — ABNORMAL HIGH (ref 98–109)
EGFR: 27 mL/min/{1.73_m2} — ABNORMAL LOW (ref >90)
GLUCOSE: 145 mg/dL — AB (ref 70–140)
Potassium: 4.7 mEq/L (ref 3.5–5.1)
SODIUM: 142 meq/L (ref 136–145)
Total Bilirubin: 0.83 mg/dL (ref 0.20–1.20)
Total Protein: 7.1 g/dL (ref 6.4–8.3)

## 2016-10-22 LAB — CBC & DIFF AND RETIC
BASO%: 0.5 % (ref 0.0–2.0)
Basophils Absolute: 0 10*3/uL (ref 0.0–0.1)
EOS%: 5.7 % (ref 0.0–7.0)
Eosinophils Absolute: 0.2 10*3/uL (ref 0.0–0.5)
HCT: 31.7 % — ABNORMAL LOW (ref 38.4–49.9)
HGB: 10.8 g/dL — ABNORMAL LOW (ref 13.0–17.1)
IMMATURE RETIC FRACT: 3.8 % (ref 3.00–10.60)
LYMPH#: 1.2 10*3/uL (ref 0.9–3.3)
LYMPH%: 31.6 % (ref 14.0–49.0)
MCH: 34.8 pg — ABNORMAL HIGH (ref 27.2–33.4)
MCHC: 34.1 g/dL (ref 32.0–36.0)
MCV: 102.3 fL — AB (ref 79.3–98.0)
MONO#: 0.3 10*3/uL (ref 0.1–0.9)
MONO%: 7.3 % (ref 0.0–14.0)
NEUT%: 54.9 % (ref 39.0–75.0)
NEUTROS ABS: 2 10*3/uL (ref 1.5–6.5)
PLATELETS: 146 10*3/uL (ref 140–400)
RBC: 3.1 10*6/uL — AB (ref 4.20–5.82)
RDW: 12.8 % (ref 11.0–14.6)
RETIC CT ABS: 70.99 10*3/uL (ref 34.80–93.90)
Retic %: 2.29 % — ABNORMAL HIGH (ref 0.80–1.80)
WBC: 3.7 10*3/uL — AB (ref 4.0–10.3)

## 2016-10-22 NOTE — Progress Notes (Signed)
  [facility] OFFICE PROGRESS NOTE   Diagnosis: Serum monoclonal protein, anemia  INTERVAL HISTORY:   [name] returns as scheduled. He feels well. He has chronic discomfort in the right "hamstring ". No other complaint. Good appetite and energy level.  Objective:  Vital signs in last 24 hours:  Blood pressure (!) 143/70, pulse 79, temperature 98 F (36.7 C), temperature source Oral, resp. rate 18, height '5\' 8"'$  (1.727 m), weight 158 lb 6.4 oz (71.8 kg), SpO2 100 %.    HEENT: Neck without mass Lymphatics: No cervical, supraclavicular, axillary, or inguinal nodes Resp: Scattered mild inspiratory/expiratory wheeze, no respiratory distress Cardio: Regular rate and rhythm GI: No hepatosplenomegaly Vascular: No leg edema   Lab Results:  Lab Results  Component Value Date   WBC 3.7 (L) 10/22/2016   HGB 10.8 (L) 10/22/2016   HCT 31.7 (L) 10/22/2016   MCV 102.3 (H) 10/22/2016   PLT 146 10/22/2016   NEUTROABS 2.0 10/22/2016   BUN 16.5, creatinine 2.4, calcium 10, albumin 3.8  IgG and serum protein pheresis pending  Medications: I have reviewed the patient's current medications.  Assessment/Plan: 1. Macrocytic anemia  2. Renal insufficiency  3. Serum monoclonal IgG kappa protein  4. Hypertension  5. Idiopathic nonimmune hemolytic anemia/thrombocytopenia in 2014, spontaneously resolved   Disposition:  [name] is stable from a hematologic standpoint. No clinical evidence for the development of myeloma or a lymphoproliferative disorder. We will follow-up on the IgG level and kappa light chains from today.  He has stable mild macrocytic anemia, possibly related to low-grade hemolysis.  The chronic renal failure appears unchanged.  We decided to hold on a bone marrow biopsy. He will return for an office and lab visit in 4 months.  Betsy Coder, MD  10/22/2016  3:35 PM

## 2016-10-22 NOTE — Telephone Encounter (Signed)
Appointments scheduled per 1/16 LOS. Patient given AVS report and calendars with future scheduled appointments. °

## 2016-10-23 LAB — KAPPA/LAMBDA LIGHT CHAINS
Ig Kappa Free Light Chain: 210.1 mg/L — ABNORMAL HIGH (ref 3.3–19.4)
Ig Lambda Free Light Chain: 25.8 mg/L (ref 5.7–26.3)
KAPPA/LAMBDA FLC RATIO: 8.14 — AB (ref 0.26–1.65)

## 2016-10-23 LAB — IGG: IgG, Qn, Serum: 1192 mg/dL (ref 700–1600)

## 2016-10-24 LAB — PROTEIN ELECTROPHORESIS, SERUM
A/G RATIO SPE: 1.2 (ref 0.7–1.7)
ALBUMIN: 3.8 g/dL (ref 2.9–4.4)
Alpha 1: 0.2 g/dL (ref 0.0–0.4)
Alpha 2: 0.5 g/dL (ref 0.4–1.0)
BETA: 1.2 g/dL (ref 0.7–1.3)
GAMMA GLOBULIN: 1.2 g/dL (ref 0.4–1.8)
Globulin, Total: 3.1 g/dL (ref 2.2–3.9)
M-SPIKE, %: 0.8 g/dL — AB
Total Protein: 6.9 g/dL (ref 6.0–8.5)

## 2016-10-28 ENCOUNTER — Telehealth: Payer: Self-pay | Admitting: *Deleted

## 2016-10-28 NOTE — Telephone Encounter (Signed)
Called pt with stable lab result. He will follow up as scheduled.

## 2016-10-28 NOTE — Telephone Encounter (Signed)
-----   Message from Ladell Pier, MD sent at 10/26/2016 11:41 AM EST ----- Please call patient, labs are stable, f/u as scheduled

## 2017-02-19 ENCOUNTER — Telehealth: Payer: Self-pay | Admitting: Oncology

## 2017-02-19 ENCOUNTER — Ambulatory Visit (HOSPITAL_BASED_OUTPATIENT_CLINIC_OR_DEPARTMENT_OTHER): Payer: Medicare Other | Admitting: Nurse Practitioner

## 2017-02-19 ENCOUNTER — Other Ambulatory Visit (HOSPITAL_BASED_OUTPATIENT_CLINIC_OR_DEPARTMENT_OTHER): Payer: Medicare Other

## 2017-02-19 VITALS — BP 149/82 | HR 78 | Temp 98.0°F | Resp 18 | Ht 68.0 in | Wt 156.4 lb

## 2017-02-19 DIAGNOSIS — D539 Nutritional anemia, unspecified: Secondary | ICD-10-CM

## 2017-02-19 DIAGNOSIS — D6489 Other specified anemias: Secondary | ICD-10-CM | POA: Diagnosis not present

## 2017-02-19 DIAGNOSIS — D472 Monoclonal gammopathy: Secondary | ICD-10-CM | POA: Diagnosis present

## 2017-02-19 LAB — CBC WITH DIFFERENTIAL/PLATELET
BASO%: 0.8 % (ref 0.0–2.0)
BASOS ABS: 0 10*3/uL (ref 0.0–0.1)
EOS ABS: 0.2 10*3/uL (ref 0.0–0.5)
EOS%: 6 % (ref 0.0–7.0)
HCT: 32.8 % — ABNORMAL LOW (ref 38.4–49.9)
HEMOGLOBIN: 11.3 g/dL — AB (ref 13.0–17.1)
LYMPH%: 25.8 % (ref 14.0–49.0)
MCH: 36.3 pg — AB (ref 27.2–33.4)
MCHC: 34.4 g/dL (ref 32.0–36.0)
MCV: 105.3 fL — AB (ref 79.3–98.0)
MONO#: 0.2 10*3/uL (ref 0.1–0.9)
MONO%: 5.7 % (ref 0.0–14.0)
NEUT#: 2.3 10*3/uL (ref 1.5–6.5)
NEUT%: 61.7 % (ref 39.0–75.0)
Platelets: 158 10*3/uL (ref 140–400)
RBC: 3.12 10*6/uL — ABNORMAL LOW (ref 4.20–5.82)
RDW: 12.9 % (ref 11.0–14.6)
WBC: 3.7 10*3/uL — AB (ref 4.0–10.3)
lymph#: 1 10*3/uL (ref 0.9–3.3)

## 2017-02-19 LAB — BASIC METABOLIC PANEL
ANION GAP: 7 meq/L (ref 3–11)
BUN: 18.9 mg/dL (ref 7.0–26.0)
CALCIUM: 10.1 mg/dL (ref 8.4–10.4)
CO2: 23 meq/L (ref 22–29)
CREATININE: 2.4 mg/dL — AB (ref 0.7–1.3)
Chloride: 112 mEq/L — ABNORMAL HIGH (ref 98–109)
EGFR: 28 mL/min/{1.73_m2} — ABNORMAL LOW (ref >90)
Glucose: 150 mg/dl — ABNORMAL HIGH (ref 70–140)
Potassium: 4.9 mEq/L (ref 3.5–5.1)
SODIUM: 142 meq/L (ref 136–145)

## 2017-02-19 NOTE — Progress Notes (Signed)
  Rodney Elliott   Diagnosis:  Serum monoclonal protein, anemia  INTERVAL HISTORY:   Rodney Elliott returns as scheduled. He feels well. No interim illnesses or infections. He denies pain. No fevers or sweats. He reports a good appetite.  Objective:  Vital signs in last 24 hours:  Blood pressure (!) 149/82, pulse 78, temperature 98 F (36.7 C), temperature source Oral, resp. rate 18, height 5\' 8"  (1.727 m), weight 156 lb 6.4 oz (70.9 kg), SpO2 99 %.    HEENT: Neck without mass. Lymphatics: No palpable cervical, supra clavicular or axillary lymph nodes. Resp: Lungs clear bilaterally. Cardio: Regular rate and rhythm. GI: Abdomen soft and nontender. No organomegaly. Vascular: No leg edema.   Lab Results:  Lab Results  Component Value Date   WBC 3.7 (L) 02/19/2017   HGB 11.3 (L) 02/19/2017   HCT 32.8 (L) 02/19/2017   MCV 105.3 (H) 02/19/2017   PLT 158 02/19/2017   NEUTROABS 2.3 02/19/2017   IgG, serum light chains and SPEP pending Imaging:  No results found.  Medications: I have reviewed the patient's current medications.  Assessment/Plan: 1. Macrocytic anemia  2. Renal insufficiency  3. Serum monoclonal IgG kappa protein  4. Hypertension  5. Idiopathic nonimmune hemolytic anemia/thrombocytopenia in 2014, spontaneously resolved   Disposition: Rodney Elliott remains stable from a hematologic standpoint. There is no clinical evidence for development of myeloma or a lymphoproliferative disorder. We will follow-up on the outstanding labs from today including IgG, serum light chains and serum protein electrophoresis.  He continues to have a mild macrocytic anemia. Hemoglobin is stable to slightly improved on labs today.  Creatinine is stable.  He will return for labs and a follow-up visit in 4 months. He will contact the office in the interim with any problems.    Rodney Elliott ANP/GNP-BC   02/19/2017  10:55 AM

## 2017-02-19 NOTE — Telephone Encounter (Signed)
Appointments scheduled per 02/19/17 los. Patient was given a copy of the AVS report and appointment schedule per 02/19/17 los. °

## 2017-02-20 LAB — KAPPA/LAMBDA LIGHT CHAINS
IG LAMBDA FREE LIGHT CHAIN: 28.5 mg/L — AB (ref 5.7–26.3)
Ig Kappa Free Light Chain: 210.9 mg/L — ABNORMAL HIGH (ref 3.3–19.4)
KAPPA/LAMBDA FLC RATIO: 7.4 — AB (ref 0.26–1.65)

## 2017-02-20 LAB — IGG: IgG, Qn, Serum: 1136 mg/dL (ref 700–1600)

## 2017-02-21 LAB — PROTEIN ELECTROPHORESIS, SERUM
A/G Ratio: 1.4 (ref 0.7–1.7)
ALPHA 2: 0.4 g/dL (ref 0.4–1.0)
Albumin: 4 g/dL (ref 2.9–4.4)
Alpha 1: 0.2 g/dL (ref 0.0–0.4)
Beta: 1.1 g/dL (ref 0.7–1.3)
GAMMA GLOBULIN: 1.1 g/dL (ref 0.4–1.8)
GLOBULIN, TOTAL: 2.8 g/dL (ref 2.2–3.9)
M-SPIKE, %: 0.7 g/dL — AB
Total Protein: 6.8 g/dL (ref 6.0–8.5)

## 2017-06-23 ENCOUNTER — Other Ambulatory Visit (HOSPITAL_BASED_OUTPATIENT_CLINIC_OR_DEPARTMENT_OTHER): Payer: Medicare Other

## 2017-06-23 ENCOUNTER — Telehealth: Payer: Self-pay | Admitting: Oncology

## 2017-06-23 ENCOUNTER — Ambulatory Visit (HOSPITAL_BASED_OUTPATIENT_CLINIC_OR_DEPARTMENT_OTHER): Payer: Medicare Other | Admitting: Oncology

## 2017-06-23 VITALS — BP 147/62 | HR 93 | Temp 97.9°F | Resp 18 | Ht 68.0 in | Wt 163.3 lb

## 2017-06-23 DIAGNOSIS — D539 Nutritional anemia, unspecified: Secondary | ICD-10-CM

## 2017-06-23 DIAGNOSIS — D649 Anemia, unspecified: Secondary | ICD-10-CM

## 2017-06-23 DIAGNOSIS — N289 Disorder of kidney and ureter, unspecified: Secondary | ICD-10-CM

## 2017-06-23 DIAGNOSIS — D472 Monoclonal gammopathy: Secondary | ICD-10-CM

## 2017-06-23 LAB — CBC WITH DIFFERENTIAL/PLATELET
BASO%: 0.7 % (ref 0.0–2.0)
BASOS ABS: 0 10*3/uL (ref 0.0–0.1)
EOS%: 4.7 % (ref 0.0–7.0)
Eosinophils Absolute: 0.2 10*3/uL (ref 0.0–0.5)
HCT: 32.3 % — ABNORMAL LOW (ref 38.4–49.9)
HEMOGLOBIN: 11.2 g/dL — AB (ref 13.0–17.1)
LYMPH%: 26.7 % (ref 14.0–49.0)
MCH: 36.6 pg — AB (ref 27.2–33.4)
MCHC: 34.6 g/dL (ref 32.0–36.0)
MCV: 105.8 fL — ABNORMAL HIGH (ref 79.3–98.0)
MONO#: 0.3 10*3/uL (ref 0.1–0.9)
MONO%: 6.4 % (ref 0.0–14.0)
NEUT#: 2.5 10*3/uL (ref 1.5–6.5)
NEUT%: 61.5 % (ref 39.0–75.0)
Platelets: 161 10*3/uL (ref 140–400)
RBC: 3.05 10*6/uL — AB (ref 4.20–5.82)
RDW: 12.7 % (ref 11.0–14.6)
WBC: 4.1 10*3/uL (ref 4.0–10.3)
lymph#: 1.1 10*3/uL (ref 0.9–3.3)

## 2017-06-23 LAB — COMPREHENSIVE METABOLIC PANEL
ALBUMIN: 3.9 g/dL (ref 3.5–5.0)
ALK PHOS: 79 U/L (ref 40–150)
ALT: 15 U/L (ref 0–55)
AST: 22 U/L (ref 5–34)
Anion Gap: 8 mEq/L (ref 3–11)
BUN: 15.2 mg/dL (ref 7.0–26.0)
CO2: 22 meq/L (ref 22–29)
Calcium: 10 mg/dL (ref 8.4–10.4)
Chloride: 113 mEq/L — ABNORMAL HIGH (ref 98–109)
Creatinine: 2.2 mg/dL — ABNORMAL HIGH (ref 0.7–1.3)
EGFR: 29 mL/min/{1.73_m2} — AB (ref >90)
Glucose: 160 mg/dl — ABNORMAL HIGH (ref 70–140)
POTASSIUM: 4.5 meq/L (ref 3.5–5.1)
SODIUM: 142 meq/L (ref 136–145)
Total Bilirubin: 0.74 mg/dL (ref 0.20–1.20)
Total Protein: 7.3 g/dL (ref 6.4–8.3)

## 2017-06-23 NOTE — Progress Notes (Addendum)
  [facility] OFFICE PROGRESS NOTE   Diagnosis: Serum monoclonal protein, anemia  INTERVAL HISTORY:   [name] returns as scheduled. He feels well. Good appetite and energy level. No recent infection. No complaint.  Objective:  Vital signs in last 24 hours:  Blood pressure (!) 147/62, pulse 93, temperature 97.9 F (36.6 C), temperature source Oral, resp. rate 18, height 5\' 8"  (1.727 m), weight 163 lb 4.8 oz (74.1 kg), SpO2 100 %.    HEENT: Neck without mass Lymphatics: No cervical, supraclavicular, axillary, or inguinal nodes Resp: Distant breath sounds no respiratory distress Cardio: Regular rate and rhythm with an occasional pause GI: No hepatosplenomegaly Vascular: No leg edema  Lab Results:  Lab Results  Component Value Date   WBC 4.1 06/23/2017   HGB 11.2 (L) 06/23/2017   HCT 32.3 (L) 06/23/2017   MCV 105.8 (H) 06/23/2017   PLT 161 06/23/2017   NEUTROABS 2.5 06/23/2017    CMP     Component Value Date/Time   NA 142 06/23/2017 1402   K 4.5 06/23/2017 1402   CO2 22 06/23/2017 1402   GLUCOSE 160 (H) 06/23/2017 1402   BUN 15.2 06/23/2017 1402   CREATININE 2.2 (H) 06/23/2017 1402   CALCIUM 10.0 06/23/2017 1402   PROT 7.3 06/23/2017 1402   ALBUMIN 3.9 06/23/2017 1402   AST 22 06/23/2017 1402   ALT 15 06/23/2017 1402   ALKPHOS 79 06/23/2017 1402   BILITOT 0.74 06/23/2017 1402   02/19/2017: IgG 1136, serum M spike-0.7 Medications: I have reviewed the patient's current medications.  Assessment/Plan: 1. Macrocytic anemia  2. Renal insufficiency  3. Serum monoclonal IgG kappa protein  4. Hypertension  5. Idiopathic nonimmune hemolytic anemia/thrombocytopenia in 2014, spontaneously resolved   Disposition:  Mr. Saindon is stable from a hematologic standpoint. He has a persistent mild macrocytic anemia. There is no clinical evidence for development of a lymphoproliferative disorder or myeloma. He will return for an office and lab  visit in 6 months. We will follow-up on the IgG level and serum M spike from today.  He will receive an influenza vaccine with Dr. Nyoka Cowden.  The mild anemia may be secondary to renal insufficiency, ongoing low-level hemolysis, or myelodysplasia.  Donneta Romberg, MD  06/23/2017  3:13 PM

## 2017-06-23 NOTE — Telephone Encounter (Signed)
Scheduled appt per 9/17 los - Gave patient AVS and calender per los.  

## 2017-06-24 LAB — IGG: IgG, Qn, Serum: 1305 mg/dL (ref 700–1600)

## 2017-06-24 LAB — PROTEIN ELECTROPHORESIS, SERUM
A/G RATIO SPE: 1.2 (ref 0.7–1.7)
Albumin: 3.7 g/dL (ref 2.9–4.4)
Alpha 1: 0.2 g/dL (ref 0.0–0.4)
Alpha 2: 0.5 g/dL (ref 0.4–1.0)
Beta: 1.2 g/dL (ref 0.7–1.3)
GAMMA GLOBULIN: 1.2 g/dL (ref 0.4–1.8)
GLOBULIN, TOTAL: 3.2 g/dL (ref 2.2–3.9)
M-Spike, %: 0.8 g/dL — ABNORMAL HIGH
TOTAL PROTEIN: 6.9 g/dL (ref 6.0–8.5)

## 2017-06-24 LAB — KAPPA/LAMBDA LIGHT CHAINS
IG KAPPA FREE LIGHT CHAIN: 216.8 mg/L — AB (ref 3.3–19.4)
Ig Lambda Free Light Chain: 28.6 mg/L — ABNORMAL HIGH (ref 5.7–26.3)
Kappa/Lambda FluidC Ratio: 7.58 — ABNORMAL HIGH (ref 0.26–1.65)

## 2017-06-25 ENCOUNTER — Telehealth: Payer: Self-pay | Admitting: Emergency Medicine

## 2017-06-25 NOTE — Telephone Encounter (Addendum)
Spoke with pt. He voiced understanding of this. Will f/u in 6 months.   ----- Message from Ladell Pier, MD sent at 06/24/2017  5:08 PM EDT ----- Please call patient, abnormal serum protein is stable, f/u as scheduled

## 2017-12-22 ENCOUNTER — Inpatient Hospital Stay: Payer: Medicare Other

## 2017-12-22 ENCOUNTER — Telehealth: Payer: Self-pay | Admitting: Oncology

## 2017-12-22 ENCOUNTER — Inpatient Hospital Stay: Payer: Medicare Other | Attending: Oncology | Admitting: Oncology

## 2017-12-22 VITALS — BP 150/72 | HR 77 | Temp 97.9°F | Resp 18 | Ht 68.0 in | Wt 158.8 lb

## 2017-12-22 DIAGNOSIS — N289 Disorder of kidney and ureter, unspecified: Secondary | ICD-10-CM | POA: Diagnosis not present

## 2017-12-22 DIAGNOSIS — I10 Essential (primary) hypertension: Secondary | ICD-10-CM | POA: Insufficient documentation

## 2017-12-22 DIAGNOSIS — D539 Nutritional anemia, unspecified: Secondary | ICD-10-CM | POA: Insufficient documentation

## 2017-12-22 DIAGNOSIS — D649 Anemia, unspecified: Secondary | ICD-10-CM

## 2017-12-22 DIAGNOSIS — D472 Monoclonal gammopathy: Secondary | ICD-10-CM | POA: Diagnosis not present

## 2017-12-22 LAB — CBC WITH DIFFERENTIAL/PLATELET
BASOS ABS: 0 10*3/uL (ref 0.0–0.1)
Basophils Relative: 1 %
EOS ABS: 0.3 10*3/uL (ref 0.0–0.5)
Eosinophils Relative: 7 %
HCT: 31.9 % — ABNORMAL LOW (ref 38.4–49.9)
HEMOGLOBIN: 10.9 g/dL — AB (ref 13.0–17.1)
Lymphocytes Relative: 29 %
Lymphs Abs: 1.1 10*3/uL (ref 0.9–3.3)
MCH: 35.4 pg — ABNORMAL HIGH (ref 27.2–33.4)
MCHC: 34.2 g/dL (ref 32.0–36.0)
MCV: 103.6 fL — ABNORMAL HIGH (ref 79.3–98.0)
Monocytes Absolute: 0.3 10*3/uL (ref 0.1–0.9)
Monocytes Relative: 7 %
NEUTROS PCT: 56 %
Neutro Abs: 2.1 10*3/uL (ref 1.5–6.5)
PLATELETS: 140 10*3/uL (ref 140–400)
RBC: 3.08 MIL/uL — AB (ref 4.20–5.82)
RDW: 12.9 % (ref 11.0–14.6)
WBC: 3.7 10*3/uL — AB (ref 4.0–10.3)

## 2017-12-22 LAB — BASIC METABOLIC PANEL
Anion gap: 5 (ref 3–11)
BUN: 13 mg/dL (ref 7–26)
CO2: 24 mmol/L (ref 22–29)
Calcium: 9.8 mg/dL (ref 8.4–10.4)
Chloride: 112 mmol/L — ABNORMAL HIGH (ref 98–109)
Creatinine, Ser: 2.26 mg/dL — ABNORMAL HIGH (ref 0.70–1.30)
GFR, EST AFRICAN AMERICAN: 28 mL/min — AB (ref >60)
GFR, EST NON AFRICAN AMERICAN: 24 mL/min — AB (ref >60)
Glucose, Bld: 148 mg/dL — ABNORMAL HIGH (ref 70–140)
POTASSIUM: 4.4 mmol/L (ref 3.5–5.1)
SODIUM: 141 mmol/L (ref 136–145)

## 2017-12-22 NOTE — Telephone Encounter (Signed)
Appointments scheduled AVS/Calendar printed per 3/18 los °

## 2017-12-22 NOTE — Progress Notes (Signed)
  [facility] OFFICE PROGRESS NOTE   Diagnosis: Monoclonal gammopathy  INTERVAL HISTORY:   [name] returns as scheduled.  He feels well.  Good appetite and energy level.  No new complaint.  No recent infection.  Objective:  Vital signs in last 24 hours:  Blood pressure (!) 150/72, pulse 77, temperature 97.9 F (36.6 C), temperature source Oral, resp. rate 18, height 5\' 8"  (1.727 m), weight 158 lb 12.8 oz (72 kg), SpO2 100 %.    HEENT: Neck without mass Lymphatics: No cervical, supraclavicular, axillary, or inguinal nodes Resp: Lungs clear bilaterally Cardio: Regular rate and rhythm GI: No hepatosplenomegaly Vascular: No leg edema   Lab Results:  Lab Results  Component Value Date   WBC 3.7 (L) 12/22/2017   HGB 10.9 (L) 12/22/2017   HCT 31.9 (L) 12/22/2017   MCV 103.6 (H) 12/22/2017   PLT 140 12/22/2017   NEUTROABS 2.1 12/22/2017    CMP     Component Value Date/Time   NA 141 12/22/2017 1428   NA 142 06/23/2017 1402   K 4.4 12/22/2017 1428   K 4.5 06/23/2017 1402   CL 112 (H) 12/22/2017 1428   CO2 24 12/22/2017 1428   CO2 22 06/23/2017 1402   GLUCOSE 148 (H) 12/22/2017 1428   GLUCOSE 160 (H) 06/23/2017 1402   BUN 13 12/22/2017 1428   BUN 15.2 06/23/2017 1402   CREATININE 2.26 (H) 12/22/2017 1428   CREATININE 2.2 (H) 06/23/2017 1402   CALCIUM 9.8 12/22/2017 1428   CALCIUM 10.0 06/23/2017 1402   PROT 7.3 06/23/2017 1402   PROT 6.9 06/23/2017 1402   ALBUMIN 3.9 06/23/2017 1402   AST 22 06/23/2017 1402   ALT 15 06/23/2017 1402   ALKPHOS 79 06/23/2017 1402   BILITOT 0.74 06/23/2017 1402   GFRNONAA 24 (L) 12/22/2017 1428   GFRAA 28 (L) 12/22/2017 1428   [place] 18-IgG 1305, kappa free light chains 217, serum M spike 0.8  Medications: I have reviewed the patient's current medications.   Assessment/Plan: 1. Macrocytic anemia  2. Renal insufficiency  3. Serum monoclonal IgG kappa protein  4. Hypertension  5. Idiopathic  nonimmune hemolytic anemia/thrombocytopenia in 2014, spontaneously resolved   Disposition: Rodney Elliott has a serum monoclonal IgG kappa protein.  This likely represents a monoclonal gammopathy of unknown significance.  The renal function and hemoglobin are stable.  We will follow-up on the IgG level and serum M spike from today.  The plan is to continue observation. He is scheduled to see Dr. Nyoka Cowden in June.  He will return for office and lab visit in 6 months.  15 minutes were spent with the patient today.  The majority of the time was used for counseling and coordination of care.  Betsy Coder, MD  12/22/2017  4:33 PM

## 2017-12-23 LAB — KAPPA/LAMBDA LIGHT CHAINS
Kappa free light chain: 207.3 mg/L — ABNORMAL HIGH (ref 3.3–19.4)
Kappa, lambda light chain ratio: 8.43 — ABNORMAL HIGH (ref 0.26–1.65)
Lambda free light chains: 24.6 mg/L (ref 5.7–26.3)

## 2017-12-23 LAB — IGG: IgG (Immunoglobin G), Serum: 1242 mg/dL (ref 700–1600)

## 2017-12-25 LAB — PROTEIN ELECTROPHORESIS, SERUM, WITH REFLEX
A/G RATIO SPE: 1.2 (ref 0.7–1.7)
Albumin ELP: 3.7 g/dL (ref 2.9–4.4)
Alpha-1-Globulin: 0.2 g/dL (ref 0.0–0.4)
Alpha-2-Globulin: 0.5 g/dL (ref 0.4–1.0)
BETA GLOBULIN: 1.1 g/dL (ref 0.7–1.3)
GAMMA GLOBULIN: 1.2 g/dL (ref 0.4–1.8)
Globulin, Total: 3 g/dL (ref 2.2–3.9)
M-Spike, %: 0.6 g/dL — ABNORMAL HIGH
SPEP Interpretation: 0
TOTAL PROTEIN ELP: 6.7 g/dL (ref 6.0–8.5)

## 2017-12-30 ENCOUNTER — Telehealth: Payer: Self-pay

## 2017-12-30 NOTE — Telephone Encounter (Addendum)
Pt voiced understanding of message below   ----- Message from Ladell Pier, MD sent at 12/25/2017  5:40 PM EDT ----- Please call patient, abnormal protein is stable, follow-up as scheduled

## 2018-05-26 ENCOUNTER — Other Ambulatory Visit: Payer: Self-pay | Admitting: Internal Medicine

## 2018-05-26 DIAGNOSIS — Q619 Cystic kidney disease, unspecified: Secondary | ICD-10-CM

## 2018-06-01 ENCOUNTER — Ambulatory Visit
Admission: RE | Admit: 2018-06-01 | Discharge: 2018-06-01 | Disposition: A | Discharge status: Home / Self Care | Payer: Medicare Other | Source: Ambulatory Visit | Attending: Internal Medicine | Admitting: Internal Medicine

## 2018-06-01 DIAGNOSIS — Q619 Cystic kidney disease, unspecified: Secondary | ICD-10-CM

## 2018-06-15 ENCOUNTER — Inpatient Hospital Stay: Payer: Medicare Other | Attending: Oncology

## 2018-06-15 ENCOUNTER — Telehealth: Payer: Self-pay | Admitting: Oncology

## 2018-06-15 ENCOUNTER — Inpatient Hospital Stay (HOSPITAL_BASED_OUTPATIENT_CLINIC_OR_DEPARTMENT_OTHER): Payer: Medicare Other | Admitting: Oncology

## 2018-06-15 ENCOUNTER — Telehealth: Payer: Self-pay | Admitting: *Deleted

## 2018-06-15 VITALS — BP 160/83 | HR 82 | Temp 98.0°F | Resp 18 | Ht 68.0 in | Wt 159.5 lb

## 2018-06-15 DIAGNOSIS — D539 Nutritional anemia, unspecified: Secondary | ICD-10-CM | POA: Diagnosis present

## 2018-06-15 DIAGNOSIS — E876 Hypokalemia: Secondary | ICD-10-CM | POA: Insufficient documentation

## 2018-06-15 DIAGNOSIS — N289 Disorder of kidney and ureter, unspecified: Secondary | ICD-10-CM | POA: Insufficient documentation

## 2018-06-15 DIAGNOSIS — D472 Monoclonal gammopathy: Secondary | ICD-10-CM | POA: Diagnosis present

## 2018-06-15 DIAGNOSIS — I10 Essential (primary) hypertension: Secondary | ICD-10-CM | POA: Diagnosis not present

## 2018-06-15 LAB — CBC WITH DIFFERENTIAL (CANCER CENTER ONLY)
Basophils Absolute: 0 10*3/uL (ref 0.0–0.1)
Basophils Relative: 1 %
Eosinophils Absolute: 0.2 10*3/uL (ref 0.0–0.5)
Eosinophils Relative: 5 %
HEMATOCRIT: 31.8 % — AB (ref 38.4–49.9)
Hemoglobin: 10.8 g/dL — ABNORMAL LOW (ref 13.0–17.1)
LYMPHS ABS: 1.2 10*3/uL (ref 0.9–3.3)
LYMPHS PCT: 27 %
MCH: 35.8 pg — AB (ref 27.2–33.4)
MCHC: 34 g/dL (ref 32.0–36.0)
MCV: 105.3 fL — AB (ref 79.3–98.0)
MONO ABS: 0.3 10*3/uL (ref 0.1–0.9)
Monocytes Relative: 6 %
NEUTROS ABS: 2.7 10*3/uL (ref 1.5–6.5)
Neutrophils Relative %: 61 %
Platelet Count: 149 10*3/uL (ref 140–400)
RBC: 3.02 MIL/uL — ABNORMAL LOW (ref 4.20–5.82)
RDW: 12.8 % (ref 11.0–14.6)
WBC Count: 4.3 10*3/uL (ref 4.0–10.3)

## 2018-06-15 LAB — BASIC METABOLIC PANEL - CANCER CENTER ONLY
ANION GAP: 6 (ref 5–15)
BUN: 12 mg/dL (ref 8–23)
CHLORIDE: 113 mmol/L — AB (ref 98–111)
CO2: 24 mmol/L (ref 22–32)
Calcium: 10.4 mg/dL — ABNORMAL HIGH (ref 8.9–10.3)
Creatinine: 2.16 mg/dL — ABNORMAL HIGH (ref 0.61–1.24)
GFR, EST NON AFRICAN AMERICAN: 26 mL/min — AB (ref >60)
GFR, Est AFR Am: 30 mL/min — ABNORMAL LOW (ref >60)
GLUCOSE: 127 mg/dL — AB (ref 70–99)
POTASSIUM: 5.4 mmol/L — AB (ref 3.5–5.1)
Sodium: 143 mmol/L (ref 135–145)

## 2018-06-15 NOTE — Telephone Encounter (Signed)
Gave pt avs and calendar  °

## 2018-06-15 NOTE — Progress Notes (Signed)
error 

## 2018-06-15 NOTE — Telephone Encounter (Signed)
Left message for return call. Patient needs to hold potassium and have his CMP rechecked in a week. Potassium was elevated. We can check here or he can return to PCP to check it.

## 2018-06-15 NOTE — Progress Notes (Signed)
  [facility] OFFICE PROGRESS NOTE   Diagnosis: Serum monoclonal protein  INTERVAL HISTORY:  Rodney Elliott returns as scheduled.  He feels well.  No recent infection.  Good appetite and energy level.  No complaint  Objective:  Vital signs in last 24 hours:  Blood pressure (!) 160/83, pulse 82, temperature 98 F (36.7 C), temperature source Oral, resp. rate 18, height 5\' 8"  (1.727 m), weight 159 lb 8 oz (72.3 kg), SpO2 100 %.    HEENT: Neck without mass Lymphatics: No cervical, supraclavicular, axillary, or inguinal nodes.  Prominent right axillary fat pad. Resp: Lungs clear bilaterally Cardio: Regular rate and rhythm GI: No hepatosplenomegaly Vascular: No leg edema   Lab Results:  Lab Results  Component Value Date   WBC 4.3 06/15/2018   HGB 10.8 (L) 06/15/2018   HCT 31.8 (L) 06/15/2018   MCV 105.3 (H) 06/15/2018   PLT 149 06/15/2018   NEUTROABS 2.7 06/15/2018    CMP  Lab Results  Component Value Date   NA 143 06/15/2018   K 5.4 (H) 06/15/2018   CL 113 (H) 06/15/2018   CO2 24 06/15/2018   GLUCOSE 127 (H) 06/15/2018   BUN 12 06/15/2018   CREATININE 2.16 (H) 06/15/2018   CALCIUM 10.4 (H) 06/15/2018   PROT 7.3 06/23/2017   PROT 6.9 06/23/2017   ALBUMIN 3.9 06/23/2017   AST 22 06/23/2017   ALT 15 06/23/2017   ALKPHOS 79 06/23/2017   BILITOT 0.74 06/23/2017   GFRNONAA 26 (L) 06/15/2018   GFRAA 30 (L) 06/15/2018   12/22/2017: IgG- 1242, free kappa light chains 207, serum M spike- 0.6 Medications: I have reviewed the patient's current medications.   Assessment/Plan: 1. Macrocytic anemia  2. Renal insufficiency  3. Serum monoclonal IgG kappa protein  4. Hypertension  5. Idiopathic nonimmune hemolytic anemia/thrombocytopenia in 2014, spontaneously resolved   Disposition: Rodney Elliott appears stable from a hematologic standpoint.  There is no clinical evidence of a progressive lymphoproliferative disorder.  The serum M spike and IgG  level were stable in March.  We will follow-up on the myeloma panel from today.  He has a high potassium level today.  I asked him to discontinue potassium.  We will contact Dr. Nyoka Cowden to see if he would like [place] to repeat the potassium level here within the next few weeks.  I will see him level is at the upper end of the normal range today.  Rodney Elliott will return for an office visit in 6 months.  Betsy Coder, MD  06/15/2018  1:23 PM

## 2018-06-16 ENCOUNTER — Other Ambulatory Visit: Payer: Self-pay | Admitting: *Deleted

## 2018-06-16 DIAGNOSIS — D649 Anemia, unspecified: Secondary | ICD-10-CM

## 2018-06-16 LAB — IGG: IgG (Immunoglobin G), Serum: 1245 mg/dL (ref 700–1600)

## 2018-06-16 LAB — KAPPA/LAMBDA LIGHT CHAINS
KAPPA, LAMDA LIGHT CHAIN RATIO: 8.49 — AB (ref 0.26–1.65)
Kappa free light chain: 227.6 mg/L — ABNORMAL HIGH (ref 3.3–19.4)
Lambda free light chains: 26.8 mg/L — ABNORMAL HIGH (ref 5.7–26.3)

## 2018-06-16 LAB — PROTEIN ELECTROPHORESIS, SERUM
A/G RATIO SPE: 1.3 (ref 0.7–1.7)
ALBUMIN ELP: 4 g/dL (ref 2.9–4.4)
ALPHA-1-GLOBULIN: 0.2 g/dL (ref 0.0–0.4)
Alpha-2-Globulin: 0.5 g/dL (ref 0.4–1.0)
Beta Globulin: 1.2 g/dL (ref 0.7–1.3)
Gamma Globulin: 1.1 g/dL (ref 0.4–1.8)
Globulin, Total: 3 g/dL (ref 2.2–3.9)
M-Spike, %: 0.6 g/dL — ABNORMAL HIGH
TOTAL PROTEIN ELP: 7 g/dL (ref 6.0–8.5)

## 2018-06-23 ENCOUNTER — Inpatient Hospital Stay: Payer: Medicare Other

## 2018-06-23 DIAGNOSIS — D649 Anemia, unspecified: Secondary | ICD-10-CM

## 2018-06-23 DIAGNOSIS — D539 Nutritional anemia, unspecified: Secondary | ICD-10-CM | POA: Diagnosis not present

## 2018-06-23 LAB — CMP (CANCER CENTER ONLY)
ALBUMIN: 3.8 g/dL (ref 3.5–5.0)
ALT: 15 U/L (ref 0–44)
AST: 21 U/L (ref 15–41)
Alkaline Phosphatase: 76 U/L (ref 38–126)
Anion gap: 6 (ref 5–15)
BUN: 17 mg/dL (ref 8–23)
CHLORIDE: 111 mmol/L (ref 98–111)
CO2: 27 mmol/L (ref 22–32)
Calcium: 10 mg/dL (ref 8.9–10.3)
Creatinine: 2.25 mg/dL — ABNORMAL HIGH (ref 0.61–1.24)
GFR, Est AFR Am: 28 mL/min — ABNORMAL LOW (ref >60)
GFR, Estimated: 25 mL/min — ABNORMAL LOW (ref >60)
GLUCOSE: 180 mg/dL — AB (ref 70–99)
POTASSIUM: 4.6 mmol/L (ref 3.5–5.1)
Sodium: 144 mmol/L (ref 135–145)
Total Bilirubin: 0.7 mg/dL (ref 0.3–1.2)
Total Protein: 7 g/dL (ref 6.5–8.1)

## 2018-06-26 ENCOUNTER — Telehealth: Payer: Self-pay | Admitting: Emergency Medicine

## 2018-06-26 NOTE — Telephone Encounter (Addendum)
Tried to reach the patient. No VM set up and only one number given.   ----- Message from Rodney Pier, MD sent at 06/23/2018  5:06 PM EDT ----- Please call patient, his kidney function is stable, the calcium and potassium are now normal, follow-up as scheduled, continue to hold potassium

## 2018-12-14 ENCOUNTER — Inpatient Hospital Stay: Payer: Medicare Other | Attending: Oncology

## 2018-12-14 ENCOUNTER — Inpatient Hospital Stay (HOSPITAL_BASED_OUTPATIENT_CLINIC_OR_DEPARTMENT_OTHER): Payer: Medicare Other | Admitting: Oncology

## 2018-12-14 ENCOUNTER — Telehealth: Payer: Self-pay | Admitting: Oncology

## 2018-12-14 ENCOUNTER — Other Ambulatory Visit: Payer: Self-pay

## 2018-12-14 VITALS — BP 151/81 | HR 70 | Temp 97.4°F | Resp 18 | Wt 156.4 lb

## 2018-12-14 DIAGNOSIS — I10 Essential (primary) hypertension: Secondary | ICD-10-CM | POA: Diagnosis not present

## 2018-12-14 DIAGNOSIS — D539 Nutritional anemia, unspecified: Secondary | ICD-10-CM | POA: Diagnosis not present

## 2018-12-14 DIAGNOSIS — D472 Monoclonal gammopathy: Secondary | ICD-10-CM

## 2018-12-14 DIAGNOSIS — N289 Disorder of kidney and ureter, unspecified: Secondary | ICD-10-CM | POA: Insufficient documentation

## 2018-12-14 LAB — BASIC METABOLIC PANEL
ANION GAP: 8 (ref 5–15)
BUN: 14 mg/dL (ref 8–23)
CO2: 24 mmol/L (ref 22–32)
Calcium: 9.6 mg/dL (ref 8.9–10.3)
Chloride: 111 mmol/L (ref 98–111)
Creatinine, Ser: 2.33 mg/dL — ABNORMAL HIGH (ref 0.61–1.24)
GFR calc Af Amer: 28 mL/min — ABNORMAL LOW (ref >60)
GFR calc non Af Amer: 24 mL/min — ABNORMAL LOW (ref >60)
GLUCOSE: 114 mg/dL — AB (ref 70–99)
Potassium: 4.6 mmol/L (ref 3.5–5.1)
Sodium: 143 mmol/L (ref 135–145)

## 2018-12-14 LAB — CBC WITH DIFFERENTIAL (CANCER CENTER ONLY)
Abs Immature Granulocytes: 0.01 10*3/uL (ref 0.00–0.07)
BASOS PCT: 0 %
Basophils Absolute: 0 10*3/uL (ref 0.0–0.1)
EOS ABS: 0.2 10*3/uL (ref 0.0–0.5)
EOS PCT: 5 %
HCT: 31.5 % — ABNORMAL LOW (ref 39.0–52.0)
Hemoglobin: 10.8 g/dL — ABNORMAL LOW (ref 13.0–17.0)
Immature Granulocytes: 0 %
Lymphocytes Relative: 23 %
Lymphs Abs: 0.9 10*3/uL (ref 0.7–4.0)
MCH: 35.6 pg — AB (ref 26.0–34.0)
MCHC: 34.3 g/dL (ref 30.0–36.0)
MCV: 104 fL — AB (ref 80.0–100.0)
MONO ABS: 0.2 10*3/uL (ref 0.1–1.0)
MONOS PCT: 6 %
NEUTROS PCT: 66 %
Neutro Abs: 2.5 10*3/uL (ref 1.7–7.7)
Platelet Count: 129 10*3/uL — ABNORMAL LOW (ref 150–400)
RBC: 3.03 MIL/uL — ABNORMAL LOW (ref 4.22–5.81)
RDW: 12.4 % (ref 11.5–15.5)
WBC Count: 3.8 10*3/uL — ABNORMAL LOW (ref 4.0–10.5)
nRBC: 0 % (ref 0.0–0.2)

## 2018-12-14 NOTE — Telephone Encounter (Signed)
Scheduled appt per 3/09 los - gave patient AVS and calender per los.   

## 2018-12-14 NOTE — Progress Notes (Signed)
  [facility] OFFICE PROGRESS NOTE   Diagnosis: Anemia, serum monoclonal protein  INTERVAL HISTORY:   Rodney Elliott returns for a scheduled visit.  He feels well.  He recently had bilateral cataract surgery.  His vision has improved.  No complaint.  No recent infection.  No pain.  He is exercising.  Objective:  Vital signs in last 24 hours:  Blood pressure (!) 151/81, pulse 70, temperature (!) 97.4 F (36.3 C), temperature source Oral, resp. rate 18, weight 156 lb 6.4 oz (70.9 kg), SpO2 99 %.  Lymphatics: No cervical, supraclavicular, axillary, or inguinal nodes Resp: Lungs clear bilaterally Cardio: Regular rate and rhythm GI: No hepatosplenomegaly Vascular: No leg edema   Lab Results:  Lab Results  Component Value Date   WBC 3.8 (L) 12/14/2018   HGB 10.8 (L) 12/14/2018   HCT 31.5 (L) 12/14/2018   MCV 104.0 (H) 12/14/2018   PLT 129 (L) 12/14/2018   NEUTROABS 2.5 12/14/2018    CMP  Lab Results  Component Value Date   NA 144 06/23/2018   K 4.6 06/23/2018   CL 111 06/23/2018   CO2 27 06/23/2018   GLUCOSE 180 (H) 06/23/2018   BUN 17 06/23/2018   CREATININE 2.25 (H) 06/23/2018   CALCIUM 10.0 06/23/2018   PROT 7.0 06/23/2018   ALBUMIN 3.8 06/23/2018   AST 21 06/23/2018   ALT 15 06/23/2018   ALKPHOS 76 06/23/2018   BILITOT 0.7 06/23/2018   GFRNONAA 25 (L) 06/23/2018   GFRAA 28 (L) 06/23/2018      Medications: I have reviewed the patient's current medications.   Assessment/Plan:  1. Macrocytic anemia  2. Renal insufficiency  3. Serum monoclonal IgG kappa protein  4. Hypertension  5. Idiopathic nonimmune hemolytic anemia/thrombocytopenia in 2014, spontaneously resolved   Disposition: Rodney Elliott appears stable.  The serum M spike and IgG level were stable when he was here last September.  We will follow-up on the myeloma panel from today. The hemoglobin is unchanged.  He has mild thrombocytopenia today.  The platelet count has not  changed significantly over the past year.  He will contact us for bleeding.  Rodney Elliott will return for an office and lab visit in 6 months.  There is no clinical evidence for progression to multiple myeloma or another lymphoproliferative disorder.  Betsy Coder, MD  12/14/2018  12:29 PM

## 2018-12-15 LAB — KAPPA/LAMBDA LIGHT CHAINS
KAPPA, LAMDA LIGHT CHAIN RATIO: 8.44 — AB (ref 0.26–1.65)
Kappa free light chain: 247.3 mg/L — ABNORMAL HIGH (ref 3.3–19.4)
LAMDA FREE LIGHT CHAINS: 29.3 mg/L — AB (ref 5.7–26.3)

## 2018-12-15 LAB — PROTEIN ELECTROPHORESIS, SERUM
A/G Ratio: 1.5 (ref 0.7–1.7)
Albumin ELP: 4 g/dL (ref 2.9–4.4)
Alpha-1-Globulin: 0.2 g/dL (ref 0.0–0.4)
Alpha-2-Globulin: 0.5 g/dL (ref 0.4–1.0)
BETA GLOBULIN: 1.1 g/dL (ref 0.7–1.3)
GAMMA GLOBULIN: 1 g/dL (ref 0.4–1.8)
Globulin, Total: 2.7 g/dL (ref 2.2–3.9)
M-SPIKE, %: 0.7 g/dL — AB
Total Protein ELP: 6.7 g/dL (ref 6.0–8.5)

## 2018-12-15 LAB — IGG, IGA, IGM
IGA: 495 mg/dL — AB (ref 61–437)
IGG (IMMUNOGLOBIN G), SERUM: 1185 mg/dL (ref 700–1600)
IgM (Immunoglobulin M), Srm: 50 mg/dL (ref 15–143)

## 2019-03-19 ENCOUNTER — Other Ambulatory Visit: Payer: Self-pay | Admitting: Internal Medicine

## 2019-03-19 ENCOUNTER — Ambulatory Visit
Admission: RE | Admit: 2019-03-19 | Discharge: 2019-03-19 | Disposition: A | Discharge status: Home / Self Care | Payer: Medicare Other | Source: Ambulatory Visit | Attending: Internal Medicine | Admitting: Internal Medicine

## 2019-03-19 DIAGNOSIS — R0689 Other abnormalities of breathing: Secondary | ICD-10-CM

## 2019-03-19 DIAGNOSIS — J45909 Unspecified asthma, uncomplicated: Secondary | ICD-10-CM

## 2019-06-17 ENCOUNTER — Inpatient Hospital Stay: Payer: Medicare Other

## 2019-06-17 ENCOUNTER — Inpatient Hospital Stay: Payer: Medicare Other | Attending: Oncology | Admitting: Oncology

## 2019-06-17 ENCOUNTER — Other Ambulatory Visit: Payer: Self-pay

## 2019-06-17 VITALS — BP 140/74 | HR 90 | Temp 98.9°F | Resp 18 | Ht 68.0 in | Wt 160.2 lb

## 2019-06-17 DIAGNOSIS — N289 Disorder of kidney and ureter, unspecified: Secondary | ICD-10-CM | POA: Diagnosis not present

## 2019-06-17 DIAGNOSIS — D472 Monoclonal gammopathy: Secondary | ICD-10-CM

## 2019-06-17 DIAGNOSIS — Z79899 Other long term (current) drug therapy: Secondary | ICD-10-CM | POA: Insufficient documentation

## 2019-06-17 DIAGNOSIS — D696 Thrombocytopenia, unspecified: Secondary | ICD-10-CM | POA: Insufficient documentation

## 2019-06-17 DIAGNOSIS — D539 Nutritional anemia, unspecified: Secondary | ICD-10-CM | POA: Insufficient documentation

## 2019-06-17 DIAGNOSIS — I10 Essential (primary) hypertension: Secondary | ICD-10-CM | POA: Insufficient documentation

## 2019-06-17 LAB — CBC WITH DIFFERENTIAL (CANCER CENTER ONLY)
Abs Immature Granulocytes: 0.02 10*3/uL (ref 0.00–0.07)
Basophils Absolute: 0 10*3/uL (ref 0.0–0.1)
Basophils Relative: 1 %
Eosinophils Absolute: 0.2 10*3/uL (ref 0.0–0.5)
Eosinophils Relative: 5 %
HCT: 32.1 % — ABNORMAL LOW (ref 39.0–52.0)
Hemoglobin: 11.1 g/dL — ABNORMAL LOW (ref 13.0–17.0)
Immature Granulocytes: 1 %
Lymphocytes Relative: 29 %
Lymphs Abs: 1.2 10*3/uL (ref 0.7–4.0)
MCH: 35.9 pg — ABNORMAL HIGH (ref 26.0–34.0)
MCHC: 34.6 g/dL (ref 30.0–36.0)
MCV: 103.9 fL — ABNORMAL HIGH (ref 80.0–100.0)
Monocytes Absolute: 0.3 10*3/uL (ref 0.1–1.0)
Monocytes Relative: 7 %
Neutro Abs: 2.4 10*3/uL (ref 1.7–7.7)
Neutrophils Relative %: 57 %
Platelet Count: 141 10*3/uL — ABNORMAL LOW (ref 150–400)
RBC: 3.09 MIL/uL — ABNORMAL LOW (ref 4.22–5.81)
RDW: 12.2 % (ref 11.5–15.5)
WBC Count: 4.2 10*3/uL (ref 4.0–10.5)
nRBC: 0 % (ref 0.0–0.2)

## 2019-06-17 LAB — BASIC METABOLIC PANEL - CANCER CENTER ONLY
Anion gap: 6 (ref 5–15)
BUN: 17 mg/dL (ref 8–23)
CO2: 26 mmol/L (ref 22–32)
Calcium: 10 mg/dL (ref 8.9–10.3)
Chloride: 110 mmol/L (ref 98–111)
Creatinine: 2.45 mg/dL — ABNORMAL HIGH (ref 0.61–1.24)
GFR, Est AFR Am: 26 mL/min — ABNORMAL LOW (ref >60)
GFR, Estimated: 23 mL/min — ABNORMAL LOW (ref >60)
Glucose, Bld: 152 mg/dL — ABNORMAL HIGH (ref 70–99)
Potassium: 4.7 mmol/L (ref 3.5–5.1)
Sodium: 142 mmol/L (ref 135–145)

## 2019-06-17 NOTE — Progress Notes (Signed)
  [facility] OFFICE PROGRESS NOTE   Diagnosis: Monoclonal gammopathy  INTERVAL HISTORY:   Mr. Essner returns as scheduled.  He reports feeling well.  Good appetite.  No complaint.  Objective:  Vital signs in last 24 hours:  Blood pressure 140/74, pulse 90, temperature 98.9 F (37.2 C), temperature source Oral, resp. rate 18, height '5\' 8"'$  (1.727 m), weight 160 lb 3.2 oz (72.7 kg), SpO2 100 %.   Limited physical examination secondary to distancing with the COVID pandemic Lymphatics: No cervical, supraclavicular, axillary, or inguinal nodes GI: No hepatosplenomegaly, nontender, reducible left inguinal hernia Vascular: No leg edema   Portacath/PICC-without erythema  Lab Results:  Lab Results  Component Value Date   WBC 4.2 06/17/2019   HGB 11.1 (L) 06/17/2019   HCT 32.1 (L) 06/17/2019   MCV 103.9 (H) 06/17/2019   PLT 141 (L) 06/17/2019   NEUTROABS 2.4 06/17/2019    CMP  Lab Results  Component Value Date   NA 142 06/17/2019   K 4.7 06/17/2019   CL 110 06/17/2019   CO2 26 06/17/2019   GLUCOSE 152 (H) 06/17/2019   BUN 17 06/17/2019   CREATININE 2.45 (H) 06/17/2019   CALCIUM 10.0 06/17/2019   PROT 7.0 06/23/2018   ALBUMIN 3.8 06/23/2018   AST 21 06/23/2018   ALT 15 06/23/2018   ALKPHOS 76 06/23/2018   BILITOT 0.7 06/23/2018   GFRNONAA 23 (L) 06/17/2019   GFRAA 26 (L) 06/17/2019  12/14/2018: IgG 1185, serum M spike 0.7, kappa light chains 247   Medications: I have reviewed the patient's current medications.   Assessment/Plan:  1. Macrocytic anemia  2. Renal insufficiency  3. Serum monoclonal IgG kappa protein  4. Hypertension  5. Idiopathic nonimmune hemolytic anemia/thrombocytopenia in 2014, spontaneously resolved    Disposition: Mr. Gilham appears unchanged.  He is stable from a hematologic standpoint and the creatinine has not changed significantly over the past few years.  I have a low clinical suspicion for progression to  multiple myeloma or another lymphoproliferative disorder.  We will follow-up on the serum protein electrophoresis, light chains, and IgG level from today.  Mr. Boutwell will obtain an influenza vaccine with Dr. Nyoka Cowden next month.  He will return for an office and lab visit in 8 months.  Betsy Coder, MD  06/17/2019  4:21 PM

## 2019-06-18 ENCOUNTER — Telehealth: Payer: Self-pay | Admitting: Medical Oncology

## 2019-06-18 ENCOUNTER — Telehealth: Payer: Self-pay | Admitting: Oncology

## 2019-06-18 LAB — KAPPA/LAMBDA LIGHT CHAINS
Kappa free light chain: 227.4 mg/L — ABNORMAL HIGH (ref 3.3–19.4)
Kappa, lambda light chain ratio: 7.08 — ABNORMAL HIGH (ref 0.26–1.65)
Lambda free light chains: 32.1 mg/L — ABNORMAL HIGH (ref 5.7–26.3)

## 2019-06-18 LAB — IGG, IGA, IGM
IgA: 473 mg/dL — ABNORMAL HIGH (ref 61–437)
IgG (Immunoglobin G), Serum: 1198 mg/dL (ref 603–1613)
IgM (Immunoglobulin M), Srm: 51 mg/dL (ref 15–143)

## 2019-06-18 NOTE — Telephone Encounter (Signed)
Called and spoke with patient. Confirmed appt  °

## 2019-06-18 NOTE — Telephone Encounter (Signed)
Pt.notified

## 2019-06-18 NOTE — Telephone Encounter (Signed)
-----   Message from Ladell Pier, MD sent at 06/18/2019  4:50 PM EDT ----- Please call patient, abnormal protein level is stable or improved, follow-up as scheduled

## 2019-06-22 LAB — PROTEIN ELECTROPHORESIS, SERUM
A/G Ratio: 1.2 (ref 0.7–1.7)
Albumin ELP: 3.7 g/dL (ref 2.9–4.4)
Alpha-1-Globulin: 0.2 g/dL (ref 0.0–0.4)
Alpha-2-Globulin: 0.5 g/dL (ref 0.4–1.0)
Beta Globulin: 1.1 g/dL (ref 0.7–1.3)
Gamma Globulin: 1.1 g/dL (ref 0.4–1.8)
Globulin, Total: 3 g/dL (ref 2.2–3.9)
M-Spike, %: 0.7 g/dL — ABNORMAL HIGH
Total Protein ELP: 6.7 g/dL (ref 6.0–8.5)

## 2019-11-27 ENCOUNTER — Ambulatory Visit: Payer: Medicare Other | Attending: Internal Medicine

## 2019-11-27 DIAGNOSIS — Z23 Encounter for immunization: Secondary | ICD-10-CM | POA: Insufficient documentation

## 2019-11-27 NOTE — Progress Notes (Signed)
   Covid-19 Vaccination Clinic  Name:  Rodney Elliott    MRN: [id number] DOB: 05-19-1928  11/27/2019  [name] was observed post Covid-19 immunization for 15 minutes without incidence. He was provided with Vaccine Information Sheet and instruction to access the V-Safe system.   [name] was instructed to call 911 with any severe reactions post vaccine: Marland Kitchen Difficulty breathing  . Swelling of your face and throat  . A fast heartbeat  . A bad rash all over your body  . Dizziness and weakness    Immunizations Administered    Name Date Dose VIS Date Route   Pfizer COVID-19 Vaccine 11/27/2019  2:27 PM 0.3 mL 09/17/2019 Intramuscular   Manufacturer: [place]   Lot: [id number]   [place]: [id number]

## 2019-12-21 ENCOUNTER — Ambulatory Visit: Payer: Medicare Other | Attending: Internal Medicine

## 2019-12-21 DIAGNOSIS — Z23 Encounter for immunization: Secondary | ICD-10-CM

## 2019-12-21 NOTE — Progress Notes (Signed)
   Covid-19 Vaccination Clinic  Name:  RAYMUND BREITENBACH    MRN: [id number] DOB: 1927/12/03  12/21/2019  Mr. Bubel was observed post Covid-19 immunization for 15 minutes without incident. He was provided with Vaccine Information Sheet and instruction to access the V-Safe system.   Mr. Newmann was instructed to call 911 with any severe reactions post vaccine: Marland Kitchen Difficulty breathing  . Swelling of face and throat  . A fast heartbeat  . A bad rash all over body  . Dizziness and weakness   Immunizations Administered    Name Date Dose VIS Date Route   Pfizer COVID-19 Vaccine 12/21/2019  1:05 PM 0.3 mL 09/17/2019 Intramuscular   Manufacturer: [place]   Lot: [id number]   [place]: [id number]

## 2020-02-14 ENCOUNTER — Other Ambulatory Visit: Payer: Self-pay

## 2020-02-14 ENCOUNTER — Inpatient Hospital Stay: Payer: Medicare Other

## 2020-02-14 ENCOUNTER — Inpatient Hospital Stay: Payer: Medicare Other | Attending: Oncology | Admitting: Oncology

## 2020-02-14 ENCOUNTER — Telehealth: Payer: Self-pay | Admitting: Oncology

## 2020-02-14 VITALS — BP 159/79 | HR 71 | Temp 98.7°F | Resp 17 | Ht 68.0 in | Wt 152.2 lb

## 2020-02-14 DIAGNOSIS — G7289 Other specified myopathies: Secondary | ICD-10-CM | POA: Insufficient documentation

## 2020-02-14 DIAGNOSIS — I10 Essential (primary) hypertension: Secondary | ICD-10-CM | POA: Diagnosis not present

## 2020-02-14 DIAGNOSIS — N289 Disorder of kidney and ureter, unspecified: Secondary | ICD-10-CM | POA: Insufficient documentation

## 2020-02-14 DIAGNOSIS — D472 Monoclonal gammopathy: Secondary | ICD-10-CM | POA: Diagnosis not present

## 2020-02-14 DIAGNOSIS — D539 Nutritional anemia, unspecified: Secondary | ICD-10-CM | POA: Diagnosis present

## 2020-02-14 DIAGNOSIS — Z79899 Other long term (current) drug therapy: Secondary | ICD-10-CM | POA: Diagnosis not present

## 2020-02-14 LAB — CBC WITH DIFFERENTIAL (CANCER CENTER ONLY)
Abs Immature Granulocytes: 0.01 10*3/uL (ref 0.00–0.07)
Basophils Absolute: 0 10*3/uL (ref 0.0–0.1)
Basophils Relative: 1 %
Eosinophils Absolute: 0.3 10*3/uL (ref 0.0–0.5)
Eosinophils Relative: 7 %
HCT: 31.9 % — ABNORMAL LOW (ref 39.0–52.0)
Hemoglobin: 10.8 g/dL — ABNORMAL LOW (ref 13.0–17.0)
Immature Granulocytes: 0 %
Lymphocytes Relative: 28 %
Lymphs Abs: 1 10*3/uL (ref 0.7–4.0)
MCH: 35.8 pg — ABNORMAL HIGH (ref 26.0–34.0)
MCHC: 33.9 g/dL (ref 30.0–36.0)
MCV: 105.6 fL — ABNORMAL HIGH (ref 80.0–100.0)
Monocytes Absolute: 0.2 10*3/uL (ref 0.1–1.0)
Monocytes Relative: 6 %
Neutro Abs: 2.1 10*3/uL (ref 1.7–7.7)
Neutrophils Relative %: 58 %
Platelet Count: 131 10*3/uL — ABNORMAL LOW (ref 150–400)
RBC: 3.02 MIL/uL — ABNORMAL LOW (ref 4.22–5.81)
RDW: 12.1 % (ref 11.5–15.5)
WBC Count: 3.7 10*3/uL — ABNORMAL LOW (ref 4.0–10.5)
nRBC: 0 % (ref 0.0–0.2)

## 2020-02-14 LAB — CMP (CANCER CENTER ONLY)
ALT: 14 U/L (ref 0–44)
AST: 23 U/L (ref 15–41)
Albumin: 3.8 g/dL (ref 3.5–5.0)
Alkaline Phosphatase: 80 U/L (ref 38–126)
Anion gap: 10 (ref 5–15)
BUN: 14 mg/dL (ref 8–23)
CO2: 25 mmol/L (ref 22–32)
Calcium: 9.7 mg/dL (ref 8.9–10.3)
Chloride: 111 mmol/L (ref 98–111)
Creatinine: 2.41 mg/dL — ABNORMAL HIGH (ref 0.61–1.24)
GFR, Est AFR Am: 27 mL/min — ABNORMAL LOW (ref >60)
GFR, Estimated: 23 mL/min — ABNORMAL LOW (ref >60)
Glucose, Bld: 111 mg/dL — ABNORMAL HIGH (ref 70–99)
Potassium: 4.7 mmol/L (ref 3.5–5.1)
Sodium: 146 mmol/L — ABNORMAL HIGH (ref 135–145)
Total Bilirubin: 0.6 mg/dL (ref 0.3–1.2)
Total Protein: 7.3 g/dL (ref 6.5–8.1)

## 2020-02-14 NOTE — Progress Notes (Signed)
  [place] OFFICE PROGRESS NOTE   Diagnosis: Monoclonal myopathy  INTERVAL HISTORY:   Rodney Elliott returns as scheduled.  He feels well.  He recently tripped and had an abrasion at his knee.  No other complaint.  No recent infection.  He reports intentional weight loss.  Objective:  Vital signs in last 24 hours:  Blood pressure (!) 159/79, pulse 71, temperature 98.7 F (37.1 C), temperature source Temporal, resp. rate 17, height '5\' 8"'$  (1.727 m), weight 152 lb 3.2 oz (69 kg), SpO2 100 %.     Lymphatics: No cervical, supraclavicular, axillary, or inguinal nodes GI: No hepatosplenomegaly, reducible hernia at the left low abdomen/inguinal region Vascular: No leg edema     Lab Results:  Lab Results  Component Value Date   WBC 3.7 (L) 02/14/2020   HGB 10.8 (L) 02/14/2020   HCT 31.9 (L) 02/14/2020   MCV 105.6 (H) 02/14/2020   PLT 131 (L) 02/14/2020   NEUTROABS 2.1 02/14/2020    CMP  Lab Results  Component Value Date   NA 142 06/17/2019   K 4.7 06/17/2019   CL 110 06/17/2019   CO2 26 06/17/2019   GLUCOSE 152 (H) 06/17/2019   BUN 17 06/17/2019   CREATININE 2.45 (H) 06/17/2019   CALCIUM 10.0 06/17/2019   PROT 7.0 06/23/2018   ALBUMIN 3.8 06/23/2018   AST 21 06/23/2018   ALT 15 06/23/2018   ALKPHOS 76 06/23/2018   BILITOT 0.7 06/23/2018   GFRNONAA 23 (L) 06/17/2019   GFRAA 26 (L) 06/17/2019     Medications: I have reviewed the patient's current medications.   Assessment/Plan: 1. Macrocytic anemia  2. Renal insufficiency  3. Serum monoclonal IgG kappa protein  4. Hypertension  5. Idiopathic nonimmune hemolytic anemia/thrombocytopenia in 2014, spontaneously resolved    Disposition: Rodney Elliott appears stable.  He has stable mild anemia, likely secondary to renal insufficiency and potentially low level hemolysis.  Sufficiency is stable.  The serum monoclonal protein, IgG level, and kappa free light chains were stable when he was here  in September 2020.  We will follow-up on the myeloma panel from today.  There is no clinical evidence for progression to multiple myeloma or another lymphoproliferative disorder.  Rodney Elliott will return for an office and lab visit in 8 months.  He is now seeing Dr. Francesco Sor for internal medicine care.  Betsy Coder, MD  02/14/2020  11:09 AM

## 2020-02-14 NOTE — Telephone Encounter (Signed)
Scheduled per 5/10 los. Pt aware of appts. Printed AVS and calender.

## 2020-02-15 LAB — PROTEIN ELECTROPHORESIS, SERUM
A/G Ratio: 1.2 (ref 0.7–1.7)
Albumin ELP: 3.8 g/dL (ref 2.9–4.4)
Alpha-1-Globulin: 0.2 g/dL (ref 0.0–0.4)
Alpha-2-Globulin: 0.5 g/dL (ref 0.4–1.0)
Beta Globulin: 1.2 g/dL (ref 0.7–1.3)
Gamma Globulin: 1.2 g/dL (ref 0.4–1.8)
Globulin, Total: 3.1 g/dL (ref 2.2–3.9)
M-Spike, %: 0.6 g/dL — ABNORMAL HIGH
Total Protein ELP: 6.9 g/dL (ref 6.0–8.5)

## 2020-02-15 LAB — IGG, IGA, IGM
IgA: 481 mg/dL — ABNORMAL HIGH (ref 61–437)
IgG (Immunoglobin G), Serum: 1218 mg/dL (ref 603–1613)
IgM (Immunoglobulin M), Srm: 46 mg/dL (ref 15–143)

## 2020-02-15 LAB — KAPPA/LAMBDA LIGHT CHAINS
Kappa free light chain: 272.6 mg/L — ABNORMAL HIGH (ref 3.3–19.4)
Kappa, lambda light chain ratio: 8.39 — ABNORMAL HIGH (ref 0.26–1.65)
Lambda free light chains: 32.5 mg/L — ABNORMAL HIGH (ref 5.7–26.3)

## 2020-02-16 ENCOUNTER — Telehealth: Payer: Self-pay | Admitting: *Deleted

## 2020-02-16 NOTE — Telephone Encounter (Signed)
-----   Message from Ladell Pier, MD sent at 02/15/2020  5:25 PM EDT ----- Please call patient, abnormal protein level is stable, follow-up as scheduled

## 2020-02-16 NOTE — Telephone Encounter (Signed)
Notified of stable M-spike results. F/U as scheduled.

## 2020-10-16 ENCOUNTER — Inpatient Hospital Stay: Payer: Medicare Other

## 2020-10-16 ENCOUNTER — Inpatient Hospital Stay: Payer: Medicare Other | Attending: Oncology | Admitting: Oncology

## 2020-10-17 ENCOUNTER — Telehealth: Payer: Self-pay

## 2020-10-17 NOTE — Telephone Encounter (Signed)
Pt contacted about 10/16/20 missed appointment. Pt did not realize he had an appointment. Will sent message to scheduler to re schedule visit lab office visit.

## 2020-10-18 ENCOUNTER — Telehealth: Payer: Self-pay | Admitting: Oncology

## 2020-10-18 NOTE — Telephone Encounter (Signed)
Scheduled appt per 1/11 sch msg - pt is aware of new appt date and time   

## 2020-11-21 ENCOUNTER — Other Ambulatory Visit: Payer: Self-pay

## 2020-11-21 ENCOUNTER — Inpatient Hospital Stay (HOSPITAL_BASED_OUTPATIENT_CLINIC_OR_DEPARTMENT_OTHER): Payer: Medicare Other | Admitting: Oncology

## 2020-11-21 ENCOUNTER — Inpatient Hospital Stay: Payer: Medicare Other | Attending: Oncology

## 2020-11-21 VITALS — BP 150/80 | HR 77 | Temp 97.4°F | Resp 16 | Ht 68.0 in | Wt 143.1 lb

## 2020-11-21 DIAGNOSIS — D472 Monoclonal gammopathy: Secondary | ICD-10-CM

## 2020-11-21 DIAGNOSIS — I10 Essential (primary) hypertension: Secondary | ICD-10-CM | POA: Insufficient documentation

## 2020-11-21 DIAGNOSIS — N289 Disorder of kidney and ureter, unspecified: Secondary | ICD-10-CM | POA: Diagnosis not present

## 2020-11-21 DIAGNOSIS — Z79899 Other long term (current) drug therapy: Secondary | ICD-10-CM | POA: Insufficient documentation

## 2020-11-21 DIAGNOSIS — M549 Dorsalgia, unspecified: Secondary | ICD-10-CM | POA: Insufficient documentation

## 2020-11-21 DIAGNOSIS — D539 Nutritional anemia, unspecified: Secondary | ICD-10-CM | POA: Insufficient documentation

## 2020-11-21 LAB — CBC WITH DIFFERENTIAL (CANCER CENTER ONLY)
Abs Immature Granulocytes: 0.01 10*3/uL (ref 0.00–0.07)
Basophils Absolute: 0 10*3/uL (ref 0.0–0.1)
Basophils Relative: 1 %
Eosinophils Absolute: 0.1 10*3/uL (ref 0.0–0.5)
Eosinophils Relative: 3 %
HCT: 31.9 % — ABNORMAL LOW (ref 39.0–52.0)
Hemoglobin: 10.9 g/dL — ABNORMAL LOW (ref 13.0–17.0)
Immature Granulocytes: 0 %
Lymphocytes Relative: 29 %
Lymphs Abs: 1 10*3/uL (ref 0.7–4.0)
MCH: 35.5 pg — ABNORMAL HIGH (ref 26.0–34.0)
MCHC: 34.2 g/dL (ref 30.0–36.0)
MCV: 103.9 fL — ABNORMAL HIGH (ref 80.0–100.0)
Monocytes Absolute: 0.2 10*3/uL (ref 0.1–1.0)
Monocytes Relative: 7 %
Neutro Abs: 2.1 10*3/uL (ref 1.7–7.7)
Neutrophils Relative %: 60 %
Platelet Count: 127 10*3/uL — ABNORMAL LOW (ref 150–400)
RBC: 3.07 MIL/uL — ABNORMAL LOW (ref 4.22–5.81)
RDW: 13 % (ref 11.5–15.5)
WBC Count: 3.4 10*3/uL — ABNORMAL LOW (ref 4.0–10.5)
nRBC: 0 % (ref 0.0–0.2)

## 2020-11-21 LAB — CMP (CANCER CENTER ONLY)
ALT: 10 U/L (ref 0–44)
AST: 19 U/L (ref 15–41)
Albumin: 3.9 g/dL (ref 3.5–5.0)
Alkaline Phosphatase: 79 U/L (ref 38–126)
Anion gap: 4 — ABNORMAL LOW (ref 5–15)
BUN: 20 mg/dL (ref 8–23)
CO2: 24 mmol/L (ref 22–32)
Calcium: 9.8 mg/dL (ref 8.9–10.3)
Chloride: 111 mmol/L (ref 98–111)
Creatinine: 2.71 mg/dL — ABNORMAL HIGH (ref 0.61–1.24)
GFR, Estimated: 22 mL/min — ABNORMAL LOW (ref >60)
Glucose, Bld: 141 mg/dL — ABNORMAL HIGH (ref 70–99)
Potassium: 4.6 mmol/L (ref 3.5–5.1)
Sodium: 139 mmol/L (ref 135–145)
Total Bilirubin: 0.6 mg/dL (ref 0.3–1.2)
Total Protein: 7.3 g/dL (ref 6.5–8.1)

## 2020-11-21 NOTE — Progress Notes (Signed)
  [facility] OFFICE PROGRESS NOTE   Diagnosis: Monoclonal gammopathy  INTERVAL HISTORY:   Rodney Elliott returns for a scheduled visit.  He feels well.  No specific complaint.  He reports his weight has stabilized after changing his diet.  No recent infection.  Occasional back pain.  Objective:  Vital signs in last 24 hours:  Blood pressure (!) 150/80, pulse 77, temperature (!) 97.4 F (36.3 C), temperature source Tympanic, resp. rate 16, height 5\' 8"  (1.727 m), weight 143 lb 1.6 oz (64.9 kg), SpO2 100 %.     Lymphatics: No cervical, supraclavicular, axillary, or inguinal nodes Resp: Lungs clear bilaterally Cardio: Regular rate and rhythm GI: Nontender, no hepatosplenomegaly, reducible left inguinal hernia Vascular: No leg edema    Lab Results:  Lab Results  Component Value Date   WBC 3.4 (L) 11/21/2020   HGB 10.9 (L) 11/21/2020   HCT 31.9 (L) 11/21/2020   MCV 103.9 (H) 11/21/2020   PLT 127 (L) 11/21/2020   NEUTROABS 2.1 11/21/2020    CMP  Lab Results  Component Value Date   NA 139 11/21/2020   K 4.6 11/21/2020   CL 111 11/21/2020   CO2 24 11/21/2020   GLUCOSE 141 (H) 11/21/2020   BUN 20 11/21/2020   CREATININE 2.71 (H) 11/21/2020   CALCIUM 9.8 11/21/2020   PROT 7.3 11/21/2020   ALBUMIN 3.9 11/21/2020   AST 19 11/21/2020   ALT 10 11/21/2020   ALKPHOS 79 11/21/2020   BILITOT 0.6 11/21/2020   GFRNONAA 22 (L) 11/21/2020   GFRAA 27 (L) 02/14/2020     Medications: I have reviewed the patient's current medications.   Assessment/Plan: 1. Macrocytic anemia  2. Renal insufficiency  3. Serum monoclonal IgG kappa protein  4. Hypertension  5. Idiopathic nonimmune hemolytic anemia/thrombocytopenia in 2014, spontaneously resolved     Disposition: Rodney Elliott appears unchanged.  He has lost weight over the past 9 months, but he reports his weight has recently stabilized.  We will follow up on the myeloma panel from today.  The  hemoglobin is stable, but the creatinine is slightly higher.  He may be developing progressive myeloma.  He will return for an office visit, repeat chemistry panel, and 24-hour urine for light chain excretion in 5-6 weeks.  We will consider initiating treatment for myeloma if he develops progressive pancytopenia, a further increase in the creatinine, or significant light chain proteinuria.  Betsy Coder, MD  11/21/2020  12:20 PM

## 2020-11-22 ENCOUNTER — Telehealth: Payer: Self-pay | Admitting: Oncology

## 2020-11-22 LAB — KAPPA/LAMBDA LIGHT CHAINS
Kappa free light chain: 420.1 mg/L — ABNORMAL HIGH (ref 3.3–19.4)
Kappa, lambda light chain ratio: 14.54 — ABNORMAL HIGH (ref 0.26–1.65)
Lambda free light chains: 28.9 mg/L — ABNORMAL HIGH (ref 5.7–26.3)

## 2020-11-22 NOTE — Telephone Encounter (Signed)
Scheduled appointments per 2/15 los. Spoke to patient who is aware of appointments date and times.  

## 2020-11-23 LAB — PROTEIN ELECTROPHORESIS, SERUM
A/G Ratio: 1.2 (ref 0.7–1.7)
Albumin ELP: 3.8 g/dL (ref 2.9–4.4)
Alpha-1-Globulin: 0.2 g/dL (ref 0.0–0.4)
Alpha-2-Globulin: 0.5 g/dL (ref 0.4–1.0)
Beta Globulin: 1.2 g/dL (ref 0.7–1.3)
Gamma Globulin: 1.2 g/dL (ref 0.4–1.8)
Globulin, Total: 3.1 g/dL (ref 2.2–3.9)
M-Spike, %: 0.7 g/dL — ABNORMAL HIGH
Total Protein ELP: 6.9 g/dL (ref 6.0–8.5)

## 2020-12-29 ENCOUNTER — Ambulatory Visit (HOSPITAL_COMMUNITY)
Admission: RE | Admit: 2020-12-29 | Discharge: 2020-12-29 | Disposition: A | Discharge status: Home / Self Care | Payer: Medicare Other | Source: Ambulatory Visit | Attending: Oncology | Admitting: Oncology

## 2020-12-29 ENCOUNTER — Other Ambulatory Visit: Payer: Self-pay

## 2020-12-29 DIAGNOSIS — D472 Monoclonal gammopathy: Secondary | ICD-10-CM

## 2021-01-01 ENCOUNTER — Other Ambulatory Visit: Payer: Self-pay

## 2021-01-01 ENCOUNTER — Inpatient Hospital Stay (HOSPITAL_BASED_OUTPATIENT_CLINIC_OR_DEPARTMENT_OTHER): Payer: Medicare Other | Admitting: Oncology

## 2021-01-01 ENCOUNTER — Inpatient Hospital Stay: Payer: Medicare Other | Attending: Oncology

## 2021-01-01 ENCOUNTER — Telehealth: Payer: Self-pay | Admitting: Oncology

## 2021-01-01 VITALS — BP 135/64 | HR 87 | Temp 97.5°F | Resp 16 | Ht 68.0 in | Wt 140.9 lb

## 2021-01-01 DIAGNOSIS — N289 Disorder of kidney and ureter, unspecified: Secondary | ICD-10-CM | POA: Insufficient documentation

## 2021-01-01 DIAGNOSIS — R634 Abnormal weight loss: Secondary | ICD-10-CM | POA: Insufficient documentation

## 2021-01-01 DIAGNOSIS — D472 Monoclonal gammopathy: Secondary | ICD-10-CM | POA: Diagnosis not present

## 2021-01-01 DIAGNOSIS — D539 Nutritional anemia, unspecified: Secondary | ICD-10-CM | POA: Diagnosis not present

## 2021-01-01 DIAGNOSIS — D892 Hypergammaglobulinemia, unspecified: Secondary | ICD-10-CM | POA: Diagnosis not present

## 2021-01-01 DIAGNOSIS — Z79899 Other long term (current) drug therapy: Secondary | ICD-10-CM | POA: Insufficient documentation

## 2021-01-01 DIAGNOSIS — I10 Essential (primary) hypertension: Secondary | ICD-10-CM | POA: Diagnosis not present

## 2021-01-01 LAB — CBC WITH DIFFERENTIAL (CANCER CENTER ONLY)
Abs Immature Granulocytes: 0.01 10*3/uL (ref 0.00–0.07)
Basophils Absolute: 0 10*3/uL (ref 0.0–0.1)
Basophils Relative: 1 %
Eosinophils Absolute: 0.1 10*3/uL (ref 0.0–0.5)
Eosinophils Relative: 3 %
HCT: 31.9 % — ABNORMAL LOW (ref 39.0–52.0)
Hemoglobin: 11 g/dL — ABNORMAL LOW (ref 13.0–17.0)
Immature Granulocytes: 0 %
Lymphocytes Relative: 26 %
Lymphs Abs: 1.1 10*3/uL (ref 0.7–4.0)
MCH: 35.5 pg — ABNORMAL HIGH (ref 26.0–34.0)
MCHC: 34.5 g/dL (ref 30.0–36.0)
MCV: 102.9 fL — ABNORMAL HIGH (ref 80.0–100.0)
Monocytes Absolute: 0.3 10*3/uL (ref 0.1–1.0)
Monocytes Relative: 7 %
Neutro Abs: 2.7 10*3/uL (ref 1.7–7.7)
Neutrophils Relative %: 63 %
Platelet Count: 158 10*3/uL (ref 150–400)
RBC: 3.1 MIL/uL — ABNORMAL LOW (ref 4.22–5.81)
RDW: 12.7 % (ref 11.5–15.5)
WBC Count: 4.3 10*3/uL (ref 4.0–10.5)
nRBC: 0 % (ref 0.0–0.2)

## 2021-01-01 LAB — BASIC METABOLIC PANEL - CANCER CENTER ONLY
Anion gap: 8 (ref 5–15)
BUN: 42 mg/dL — ABNORMAL HIGH (ref 8–23)
CO2: 24 mmol/L (ref 22–32)
Calcium: 10.1 mg/dL (ref 8.9–10.3)
Chloride: 110 mmol/L (ref 98–111)
Creatinine: 2.55 mg/dL — ABNORMAL HIGH (ref 0.61–1.24)
GFR, Estimated: 23 mL/min — ABNORMAL LOW (ref >60)
Glucose, Bld: 190 mg/dL — ABNORMAL HIGH (ref 70–99)
Potassium: 4.4 mmol/L (ref 3.5–5.1)
Sodium: 142 mmol/L (ref 135–145)

## 2021-01-01 NOTE — Progress Notes (Signed)
  [place] OFFICE PROGRESS NOTE   Diagnosis: Serum monoclonal protein, anemia  INTERVAL HISTORY:   Mr. Merriott returns for a scheduled visit.  He feels well.  No complaint.  He relates weight loss to eliminating ice cream from his diet.  He plans to begin Ensure.  He returned a 24-hour urine protein today.  Objective:  Vital signs in last 24 hours:  Blood pressure 135/64, pulse 87, temperature (!) 97.5 F (36.4 C), temperature source Tympanic, resp. rate 16, height $RemoveBe'5\' 8"'ucRJEQUaZ$  (1.727 m), weight 140 lb 14.4 oz (63.9 kg), SpO2 100 %.    Lymphatics: No cervical or supraclavicular nodes Resp: Lungs clear bilaterally, distant breath sounds Cardio: Distant heart sounds, regular rate and rhythm GI: Nontender, no hepatosplenomegaly Vascular: No leg edema      Lab Results:  Lab Results  Component Value Date   WBC 4.3 01/01/2021   HGB 11.0 (L) 01/01/2021   HCT 31.9 (L) 01/01/2021   MCV 102.9 (H) 01/01/2021   PLT 158 01/01/2021   NEUTROABS 2.7 01/01/2021    CMP  Lab Results  Component Value Date   NA 142 01/01/2021   K 4.4 01/01/2021   CL 110 01/01/2021   CO2 24 01/01/2021   GLUCOSE 190 (H) 01/01/2021   BUN 42 (H) 01/01/2021   CREATININE 2.55 (H) 01/01/2021   CALCIUM 10.1 01/01/2021   PROT 7.3 11/21/2020   ALBUMIN 3.9 11/21/2020   AST 19 11/21/2020   ALT 10 11/21/2020   ALKPHOS 79 11/21/2020   BILITOT 0.6 11/21/2020   GFRNONAA 23 (L) 01/01/2021   GFRAA 27 (L) 02/14/2020    No results found for: CEA1  No results found for: INR  Imaging:  DG Bone Survey Met  Result Date: 12/31/2020 CLINICAL DATA:  Paraproteinemia.  Evaluate for multiple myeloma. EXAM: METASTATIC BONE SURVEY COMPARISON:  None. FINDINGS: Possible subtle lytic lesion in the calvarium and another in the proximal left femur. No other bony lesions identified. IMPRESSION: Suggested subtle lytic lesion in the calvarium and another in the proximal left femur. No other significant bony  abnormalities. Electronically Signed   By: Dorise Bullion III M.D   On: 12/31/2020 11:40    Medications: I have reviewed the patient's current medications.   Assessment/Plan:  1. Macrocytic anemia  2. Renal insufficiency  3. Serum monoclonal IgG kappa protein  Bone survey 12/29/2020-subtle lytic lesions in the calvarium and proximal left femur  4. Hypertension  5. Idiopathic nonimmune hemolytic anemia/thrombocytopenia in 2014, spontaneously resolved    Disposition: Mr. Tibbetts appears unchanged.  I encouraged him to add Ensure to his diet and not try to lose more weight.  When he was here in February the creatinine and free kappa light chains were higher higher.  The creatinine is better today.  He is stable from a hematologic standpoint.  We will follow up on the myeloma panel from today.  I reviewed the bone survey images with Mr. Ramakrishnan.  There may be subtle lytic lesions at the skull and left femur.  He denies bone pain.  The plan is to continue observation and follow-up on the myeloma panel from today.  He will return for an office and lab visit in 2-3 months.  We will initiate treatment for myeloma when there is clear evidence of disease progression.  Betsy Coder, MD  01/01/2021  1:34 PM

## 2021-01-01 NOTE — Telephone Encounter (Signed)
Scheduled appt per 3/28 los  - mailed letter with appt date and time   

## 2021-01-02 LAB — IGG: IgG (Immunoglobin G), Serum: 1406 mg/dL (ref 603–1613)

## 2021-01-02 LAB — KAPPA/LAMBDA LIGHT CHAINS
Kappa free light chain: 499.4 mg/L — ABNORMAL HIGH (ref 3.3–19.4)
Kappa, lambda light chain ratio: 15.75 — ABNORMAL HIGH (ref 0.26–1.65)
Lambda free light chains: 31.7 mg/L — ABNORMAL HIGH (ref 5.7–26.3)

## 2021-01-03 LAB — UPEP/UIFE/LIGHT CHAINS/TP, 24-HR UR
% BETA, Urine: 63.9 %
ALPHA 1 URINE: 3 %
Albumin, U: 7.5 %
Alpha 2, Urine: 12.8 %
Free Kappa Lt Chains,Ur: 809.61 mg/L — ABNORMAL HIGH (ref 1.17–86.46)
Free Kappa/Lambda Ratio: 50.29 — ABNORMAL HIGH (ref 1.83–14.26)
Free Lambda Lt Chains,Ur: 16.1 mg/L — ABNORMAL HIGH (ref 0.27–15.21)
GAMMA GLOBULIN URINE: 12.8 %
M-SPIKE %, Urine: 42.4 % — ABNORMAL HIGH
M-Spike, Mg/24 Hr: 55 mg/24 hr — ABNORMAL HIGH
Total Protein, Urine-Ur/day: 129 mg/24 hr (ref 30–150)
Total Protein, Urine: 11.7 mg/dL
Total Volume: 1100

## 2021-01-03 LAB — PROTEIN ELECTROPHORESIS, SERUM
A/G Ratio: 1.2 (ref 0.7–1.7)
Albumin ELP: 3.9 g/dL (ref 2.9–4.4)
Alpha-1-Globulin: 0.2 g/dL (ref 0.0–0.4)
Alpha-2-Globulin: 0.6 g/dL (ref 0.4–1.0)
Beta Globulin: 1.3 g/dL (ref 0.7–1.3)
Gamma Globulin: 1.2 g/dL (ref 0.4–1.8)
Globulin, Total: 3.3 g/dL (ref 2.2–3.9)
M-Spike, %: 1 g/dL — ABNORMAL HIGH
Total Protein ELP: 7.2 g/dL (ref 6.0–8.5)

## 2021-01-08 ENCOUNTER — Telehealth: Payer: Self-pay

## 2021-01-08 NOTE — Telephone Encounter (Signed)
-----   Message from Ladell Pier, MD sent at 01/07/2021  7:34 AM EDT ----- Please call patient, myeloma markers are slowly increasing in blood and urine, will likely need treatment soon, move next office and lab to 4 weeks from last visit

## 2021-01-08 NOTE — Telephone Encounter (Signed)
Per Dr. Benay Spice T/C to Pt to inform him that myeloma markers are increasing in blood and urine.Dr. Benay Spice would like to see him in the next 4 weeks to discuss treatment.Pt. verbalized understanding. Schedule message sent to schedule Pt in the next 4 weeks.

## 2021-01-29 ENCOUNTER — Inpatient Hospital Stay (HOSPITAL_BASED_OUTPATIENT_CLINIC_OR_DEPARTMENT_OTHER): Payer: Medicare Other | Admitting: Oncology

## 2021-01-29 ENCOUNTER — Other Ambulatory Visit: Payer: Self-pay

## 2021-01-29 ENCOUNTER — Inpatient Hospital Stay: Payer: Medicare Other | Attending: Oncology

## 2021-01-29 VITALS — BP 140/70 | HR 81 | Temp 98.0°F | Resp 18 | Ht 68.0 in | Wt 141.2 lb

## 2021-01-29 DIAGNOSIS — I10 Essential (primary) hypertension: Secondary | ICD-10-CM

## 2021-01-29 DIAGNOSIS — N289 Disorder of kidney and ureter, unspecified: Secondary | ICD-10-CM | POA: Diagnosis not present

## 2021-01-29 DIAGNOSIS — D539 Nutritional anemia, unspecified: Secondary | ICD-10-CM | POA: Diagnosis not present

## 2021-01-29 DIAGNOSIS — Z79899 Other long term (current) drug therapy: Secondary | ICD-10-CM | POA: Insufficient documentation

## 2021-01-29 DIAGNOSIS — D472 Monoclonal gammopathy: Secondary | ICD-10-CM | POA: Diagnosis not present

## 2021-01-29 DIAGNOSIS — Z862 Personal history of diseases of the blood and blood-forming organs and certain disorders involving the immune mechanism: Secondary | ICD-10-CM | POA: Diagnosis not present

## 2021-01-29 DIAGNOSIS — D613 Idiopathic aplastic anemia: Secondary | ICD-10-CM | POA: Diagnosis not present

## 2021-01-29 LAB — CMP (CANCER CENTER ONLY)
ALT: 23 U/L (ref 0–44)
AST: 23 U/L (ref 15–41)
Albumin: 4.1 g/dL (ref 3.5–5.0)
Alkaline Phosphatase: 59 U/L (ref 38–126)
Anion gap: 5 (ref 5–15)
BUN: 39 mg/dL — ABNORMAL HIGH (ref 8–23)
CO2: 24 mmol/L (ref 22–32)
Calcium: 10.6 mg/dL — ABNORMAL HIGH (ref 8.9–10.3)
Chloride: 111 mmol/L (ref 98–111)
Creatinine: 2.16 mg/dL — ABNORMAL HIGH (ref 0.61–1.24)
GFR, Estimated: 28 mL/min — ABNORMAL LOW (ref >60)
Glucose, Bld: 152 mg/dL — ABNORMAL HIGH (ref 70–99)
Potassium: 4.9 mmol/L (ref 3.5–5.1)
Sodium: 140 mmol/L (ref 135–145)
Total Bilirubin: 0.6 mg/dL (ref 0.3–1.2)
Total Protein: 7.1 g/dL (ref 6.5–8.1)

## 2021-01-29 LAB — CBC WITH DIFFERENTIAL (CANCER CENTER ONLY)
Abs Immature Granulocytes: 0.01 10*3/uL (ref 0.00–0.07)
Basophils Absolute: 0 10*3/uL (ref 0.0–0.1)
Basophils Relative: 0 %
Eosinophils Absolute: 0.1 10*3/uL (ref 0.0–0.5)
Eosinophils Relative: 3 %
HCT: 31.9 % — ABNORMAL LOW (ref 39.0–52.0)
Hemoglobin: 11.1 g/dL — ABNORMAL LOW (ref 13.0–17.0)
Immature Granulocytes: 0 %
Lymphocytes Relative: 22 %
Lymphs Abs: 1 10*3/uL (ref 0.7–4.0)
MCH: 35.9 pg — ABNORMAL HIGH (ref 26.0–34.0)
MCHC: 34.8 g/dL (ref 30.0–36.0)
MCV: 103.2 fL — ABNORMAL HIGH (ref 80.0–100.0)
Monocytes Absolute: 0.3 10*3/uL (ref 0.1–1.0)
Monocytes Relative: 6 %
Neutro Abs: 3.1 10*3/uL (ref 1.7–7.7)
Neutrophils Relative %: 69 %
Platelet Count: 150 10*3/uL (ref 150–400)
RBC: 3.09 MIL/uL — ABNORMAL LOW (ref 4.22–5.81)
RDW: 13.3 % (ref 11.5–15.5)
WBC Count: 4.6 10*3/uL (ref 4.0–10.5)
nRBC: 0 % (ref 0.0–0.2)

## 2021-01-29 NOTE — Progress Notes (Signed)
  Rodney Elliott   Diagnosis: Monoclonal protein  INTERVAL HISTORY:   Rodney Elliott returns as scheduled.  He feels well.  No complaint.  Objective:  Vital signs in last 24 hours:  Blood pressure 140/70, pulse 81, temperature 98 F (36.7 C), temperature source Tympanic, resp. rate 18, height 5' 8" (1.727 m), weight 141 lb 3.2 oz (64 kg), SpO2 100 %.    Resp: Lungs clear bilaterally Cardio: Regular rate and rhythm GI: No hepatosplenomegaly Vascular: No leg edema   Lab Results:  Lab Results  Component Value Date   WBC 4.6 01/29/2021   HGB 11.1 (L) 01/29/2021   HCT 31.9 (L) 01/29/2021   MCV 103.2 (H) 01/29/2021   PLT 150 01/29/2021   NEUTROABS 3.1 01/29/2021    CMP  Lab Results  Component Value Date   NA 140 01/29/2021   K 4.9 01/29/2021   CL 111 01/29/2021   CO2 24 01/29/2021   GLUCOSE 152 (H) 01/29/2021   BUN 39 (H) 01/29/2021   CREATININE 2.16 (H) 01/29/2021   CALCIUM 10.6 (H) 01/29/2021   PROT 7.1 01/29/2021   ALBUMIN 4.1 01/29/2021   AST 23 01/29/2021   ALT 23 01/29/2021   ALKPHOS 59 01/29/2021   BILITOT 0.6 01/29/2021   GFRNONAA 28 (L) 01/29/2021   GFRAA 27 (L) 02/14/2020    Medications: I have reviewed the patient's current medications.   Assessment/Plan:  1. Macrocytic anemia  2. Renal insufficiency  3. Serum monoclonal IgG kappa protein  Bone survey 12/29/2020-subtle lytic lesions in the calvarium and proximal left femur  Progressive elevation of free kappa light chains and increased kappa light chain proteinuria March 2022  4. Hypertension  5. Idiopathic nonimmune hemolytic anemia/thrombocytopenia in 2014, spontaneously resolved   Disposition: Rodney Elliott appears stable.  He has a longstanding history of a serum monoclonal IgG kappa protein.  The serum free kappa light chains and 24-hour urine kappa light chain excretion were elevated last month.  The calcium level is mildly elevated today.  He  appears asymptomatic.  I am concerned he is developing progressive multiple myeloma.  He agrees to a diagnostic bone marrow biopsy.  We will schedule this for soon as possible.  He will return for an office visit to discuss treatment options next week.  Rodney Elliott will call for symptoms of hypercalcemia.  We will begin treatment with Velcade/Decadron and consider RVD therapy if the bone marrow confirms a diagnosis of myeloma.  Betsy Coder, MD  01/29/2021  12:18 PM

## 2021-01-30 LAB — KAPPA/LAMBDA LIGHT CHAINS
Kappa free light chain: 420.9 mg/L — ABNORMAL HIGH (ref 3.3–19.4)
Kappa, lambda light chain ratio: 15.14 — ABNORMAL HIGH (ref 0.26–1.65)
Lambda free light chains: 27.8 mg/L — ABNORMAL HIGH (ref 5.7–26.3)

## 2021-01-30 LAB — IGG: IgG (Immunoglobin G), Serum: 1388 mg/dL (ref 603–1613)

## 2021-01-31 LAB — PROTEIN ELECTROPHORESIS, SERUM
A/G Ratio: 1.2 (ref 0.7–1.7)
Albumin ELP: 3.8 g/dL (ref 2.9–4.4)
Alpha-1-Globulin: 0.2 g/dL (ref 0.0–0.4)
Alpha-2-Globulin: 0.5 g/dL (ref 0.4–1.0)
Beta Globulin: 1.2 g/dL (ref 0.7–1.3)
Gamma Globulin: 1.3 g/dL (ref 0.4–1.8)
Globulin, Total: 3.1 g/dL (ref 2.2–3.9)
M-Spike, %: 0.8 g/dL — ABNORMAL HIGH
Total Protein ELP: 6.9 g/dL (ref 6.0–8.5)

## 2021-02-07 ENCOUNTER — Ambulatory Visit: Payer: Medicare Other | Admitting: Oncology

## 2021-02-07 ENCOUNTER — Other Ambulatory Visit: Payer: Medicare Other

## 2021-02-08 ENCOUNTER — Other Ambulatory Visit: Payer: Self-pay | Admitting: Student

## 2021-02-09 ENCOUNTER — Other Ambulatory Visit: Payer: Self-pay | Admitting: Student

## 2021-02-12 ENCOUNTER — Ambulatory Visit (HOSPITAL_COMMUNITY)
Admission: RE | Admit: 2021-02-12 | Discharge: 2021-02-12 | Disposition: A | Discharge status: Home / Self Care | Payer: Medicare Other | Source: Ambulatory Visit | Attending: Oncology | Admitting: Oncology

## 2021-02-12 ENCOUNTER — Other Ambulatory Visit: Payer: Self-pay

## 2021-02-12 ENCOUNTER — Encounter (HOSPITAL_COMMUNITY): Payer: Self-pay

## 2021-02-12 DIAGNOSIS — I10 Essential (primary) hypertension: Secondary | ICD-10-CM | POA: Diagnosis not present

## 2021-02-12 DIAGNOSIS — M899 Disorder of bone, unspecified: Secondary | ICD-10-CM | POA: Diagnosis not present

## 2021-02-12 DIAGNOSIS — C9000 Multiple myeloma not having achieved remission: Secondary | ICD-10-CM | POA: Diagnosis not present

## 2021-02-12 DIAGNOSIS — D539 Nutritional anemia, unspecified: Secondary | ICD-10-CM | POA: Diagnosis not present

## 2021-02-12 DIAGNOSIS — Z7951 Long term (current) use of inhaled steroids: Secondary | ICD-10-CM | POA: Diagnosis not present

## 2021-02-12 DIAGNOSIS — D696 Thrombocytopenia, unspecified: Secondary | ICD-10-CM | POA: Diagnosis not present

## 2021-02-12 DIAGNOSIS — N289 Disorder of kidney and ureter, unspecified: Secondary | ICD-10-CM | POA: Diagnosis not present

## 2021-02-12 DIAGNOSIS — R894 Abnormal immunological findings in specimens from other organs, systems and tissues: Secondary | ICD-10-CM | POA: Diagnosis present

## 2021-02-12 DIAGNOSIS — D472 Monoclonal gammopathy: Secondary | ICD-10-CM

## 2021-02-12 DIAGNOSIS — Z79899 Other long term (current) drug therapy: Secondary | ICD-10-CM | POA: Insufficient documentation

## 2021-02-12 DIAGNOSIS — D72822 Plasmacytosis: Secondary | ICD-10-CM | POA: Diagnosis not present

## 2021-02-12 LAB — CBC WITH DIFFERENTIAL/PLATELET
Abs Immature Granulocytes: 0.01 10*3/uL (ref 0.00–0.07)
Basophils Absolute: 0 10*3/uL (ref 0.0–0.1)
Basophils Relative: 1 %
Eosinophils Absolute: 0.1 10*3/uL (ref 0.0–0.5)
Eosinophils Relative: 2 %
HCT: 35.1 % — ABNORMAL LOW (ref 39.0–52.0)
Hemoglobin: 11.8 g/dL — ABNORMAL LOW (ref 13.0–17.0)
Immature Granulocytes: 0 %
Lymphocytes Relative: 19 %
Lymphs Abs: 0.9 10*3/uL (ref 0.7–4.0)
MCH: 36.1 pg — ABNORMAL HIGH (ref 26.0–34.0)
MCHC: 33.6 g/dL (ref 30.0–36.0)
MCV: 107.3 fL — ABNORMAL HIGH (ref 80.0–100.0)
Monocytes Absolute: 0.3 10*3/uL (ref 0.1–1.0)
Monocytes Relative: 6 %
Neutro Abs: 3.6 10*3/uL (ref 1.7–7.7)
Neutrophils Relative %: 72 %
Platelets: 145 10*3/uL — ABNORMAL LOW (ref 150–400)
RBC: 3.27 MIL/uL — ABNORMAL LOW (ref 4.22–5.81)
RDW: 13.2 % (ref 11.5–15.5)
WBC: 5 10*3/uL (ref 4.0–10.5)
nRBC: 0 % (ref 0.0–0.2)

## 2021-02-12 MED ORDER — SODIUM CHLORIDE 0.9 % IV SOLN: INTRAVENOUS | Status: DC

## 2021-02-12 MED ORDER — FENTANYL CITRATE (PF) 100 MCG/2ML IJ SOLN
INTRAMUSCULAR | Status: AC
  Filled 2021-02-12: qty 2

## 2021-02-12 MED ORDER — MIDAZOLAM HCL 2 MG/2ML IJ SOLN
INTRAMUSCULAR | Status: AC
  Filled 2021-02-12: qty 2

## 2021-02-12 MED ORDER — MIDAZOLAM HCL 2 MG/2ML IJ SOLN
INTRAMUSCULAR | Status: AC | PRN
  Administered 2021-02-12 (×2): 0.5 mg via INTRAVENOUS

## 2021-02-12 MED ORDER — LIDOCAINE HCL (PF) 1 % IJ SOLN
INTRAMUSCULAR | Status: AC | PRN
  Administered 2021-02-12: 10 mL

## 2021-02-12 MED ORDER — FENTANYL CITRATE (PF) 100 MCG/2ML IJ SOLN
INTRAMUSCULAR | Status: AC | PRN
  Administered 2021-02-12 (×2): 50 ug via INTRAVENOUS

## 2021-02-12 NOTE — Discharge Instructions (Signed)
Interventional radiology phone numbers [phone] After hours [phone]  Bone Marrow Aspiration and Bone Marrow Biopsy, Adult, Care After This sheet gives you information about how to care for yourself after your procedure. Your health care provider may also give you more specific instructions. If you have problems or questions, contact your health care provider. What can I expect after the procedure? After the procedure, it is common to have:  Mild pain and tenderness.  Swelling.  Bruising. Follow these instructions at home: Puncture site care 1. Follow instructions from your health care provider about how to take care of the puncture site. Make sure you: ? Wash your hands with soap and water before and after you change your bandage (dressing). If soap and water are not available, use hand sanitizer.  2. Check your puncture site every day for signs of infection. Check for: ? More redness, swelling, or pain. ? Fluid or blood. ? Warmth. ? Pus or a bad smell.   Activity 1. Return to your normal activities as told by your health care provider. Ask your health care provider what activities are safe for you. 2. Do not lift anything that is heavier than 10 lb (4.5 kg), or the limit that you are told, until your health care provider says that it is safe. 3. Do not drive for 24 hours if you were given a sedative during your procedure. General instructions 1. Take over-the-counter and prescription medicines only as told by your health care provider. 2. Do not take baths, swim, or use a hot tub until your health care provider approves. You may remove your dressing 01/06/21 and shower 24 hours after your biopsy. 3. Put ice on the affected area. To do this: ? Put ice in a plastic bag. ? Place a towel between your skin and the bag. ? Leave the ice on for 20 minutes, 2-3 times a day. 4. Keep all follow-up visits as told by your health care provider. This is important.   Contact a health care  provider if:  Your pain is not controlled with medicine.  You have a fever.  You have more redness, swelling, or pain around the puncture site.  You have fluid or blood coming from the puncture site.  Your puncture site feels warm to the touch.  You have pus or a bad smell coming from the puncture site. Summary  After the procedure, it is common to have mild pain, tenderness, swelling, and bruising.  Follow instructions from your health care provider about how to take care of the puncture site and what activities are safe for you.  Take over-the-counter and prescription medicines only as told by your health care provider.  Contact a health care provider if you have any signs of infection, such as fluid or blood coming from the puncture site. This information is not intended to replace advice given to you by your health care provider. Make sure you discuss any questions you have with your health care provider. Document Revised: 02/09/2019 Document Reviewed: 02/09/2019 Elsevier Patient Education  2021 [employer].    Moderate Conscious Sedation, Adult, Care After This sheet gives you information about how to care for yourself after your procedure. Your health care provider may also give you more specific instructions. If you have problems or questions, contact your health care provider. What can I expect after the procedure? After the procedure, it is common to have:  Sleepiness for several hours.  Impaired judgment for several hours.  Difficulty with balance.  Vomiting if you eat too soon. Follow these instructions at home: For the time period you were told by your health care provider: 3. Rest. 4. Do not participate in activities where you could fall or become injured. 5. Do not drive or use machinery. 6. Do not drink alcohol. 7. Do not take sleeping pills or medicines that cause drowsiness. 8. Do not make important decisions or sign legal documents. 9. Do not take  care of children on your own.      Eating and drinking 4. Follow the diet recommended by your health care provider. 5. Drink enough fluid to keep your urine pale yellow. 6. If you vomit: ? Drink water, juice, or soup when you can drink without vomiting. ? Make sure you have little or no nausea before eating solid foods.   General instructions 5. Take over-the-counter and prescription medicines only as told by your health care provider. 6. Have a responsible adult stay with you for the time you are told. It is important to have someone help care for you until you are awake and alert. 7. Do not smoke. 8. Keep all follow-up visits as told by your health care provider. This is important. Contact a health care provider if:  You are still sleepy or having trouble with balance after 24 hours.  You feel light-headed.  You keep feeling nauseous or you keep vomiting.  You develop a rash.  You have a fever.  You have redness or swelling around the IV site. Get help right away if:  You have trouble breathing.  You have new-onset confusion at home. Summary  After the procedure, it is common to feel sleepy, have impaired judgment, or feel nauseous if you eat too soon.  Rest after you get home. Know the things you should not do after the procedure.  Follow the diet recommended by your health care provider and drink enough fluid to keep your urine pale yellow.  Get help right away if you have trouble breathing or new-onset confusion at home. This information is not intended to replace advice given to you by your health care provider. Make sure you discuss any questions you have with your health care provider. Document Revised: 01/21/2020 Document Reviewed: 08/19/2019 Elsevier Patient Education  2021 [employer].

## 2021-02-12 NOTE — Procedures (Signed)
Pre-procedure Diagnosis: Evaluate for multiple myeloma. Post-procedure Diagnosis: Same  Technically successful CT guided bone marrow aspiration and biopsy of left iliac crest.   Complications: None Immediate  EBL: None  Signed: Sandi Mariscal Pager: [phone] 02/12/2021, 11:50 AM

## 2021-02-12 NOTE — Consult Note (Signed)
Chief Complaint: Patient was seen in consultation today for CT-guided bone marrow biopsy  Referring Physician(s): Sherrill,Gary B  Supervising Physician: Sandi Mariscal  Patient Status: [facility] - Out-[facility]  History of Present Illness: Rodney Elliott is a 87 y.o. male with longstanding history of a serum monoclonal IgG kappa protein who now presents with elevation of serum free kappa light chain and 24-hour urine kappa light chain, mildly elevated calcium, hypertension, renal insufficiency ,subtle lytic lesions in the calvarium and proximal left femur on bone scan and macrocytic anemia.  He is scheduled today for CT-guided bone marrow biopsy to rule out development of progressive multiple myeloma.  Past Medical History:  Diagnosis Date  . Tubular adenoma of colon    2010     Allergies: Patient has no known allergies.  Medications: Prior to Admission medications   Medication Sig Start Date End Date Taking? Authorizing Provider  ADVAIR DISKUS 100-50 MCG/DOSE AEPB Inhale 1 puff into the lungs daily. 03/11/16   [provider]  doxazosin (CARDURA) 8 MG tablet Take 8 mg by mouth daily. 02/05/16   [provider]  folic acid (FOLVITE) 1 MG tablet Take 1 mg by mouth daily. 03/27/16   [provider]  irbesartan (AVAPRO) 150 MG tablet Take 150 mg by mouth daily. 12/09/19   [provider]  loratadine (CLARITIN) 10 MG tablet Take 10 mg by mouth daily.    [provider]  metoprolol succinate (TOPROL-XL) 50 MG 24 hr tablet Take 50 mg by mouth daily. 03/18/16   [provider]  VENTOLIN HFA 108 (90 Base) MCG/ACT inhaler Inhale 2 puffs into the lungs 2 (two) times daily as needed. 03/09/16   [provider]     No family history on file.  Social History   Socioeconomic History  . Marital status: Widowed    Spouse name: Not on file  . Number of children: Not on file  . Years of education: Not on file  . Highest education level: Not on  file  Occupational History  . Not on file  Tobacco Use  . Smoking status: Not on file  . Smokeless tobacco: Not on file  Substance and Sexual Activity  . Alcohol use: Not on file  . Drug use: Not on file  . Sexual activity: Not on file  Other Topics Concern  . Not on file  Social History Narrative  . Not on file   Social Determinants of Health   Financial Resource Strain: Not on file  Food Insecurity: Not on file  Transportation Needs: Not on file  Physical Activity: Not on file  Stress: Not on file  Social Connections: Not on file      Review of Systems currently denies fever, headache, chest pain, dyspnea, cough, abdominal/back pain, nausea, vomiting or bleeding  Vital Signs: Vitals:   02/12/21 0958  BP: (!) 169/97  Pulse: 91  Resp: 20  Temp: 97.9 F (36.6 C)  SpO2: 100%      Physical Exam awake, alert.  Chest with clear but distant breath sounds bilaterally.  Heart with regular rate and rhythm.  Abdomen soft, positive bowel sounds, nontender.  No lower extremity edema.  Imaging: No results found.  Labs:  CBC: Recent Labs    02/14/20 1040 11/21/20 1121 01/01/21 1200 01/29/21 1050  WBC 3.7* 3.4* 4.3 4.6  HGB 10.8* 10.9* 11.0* 11.1*  HCT 31.9* 31.9* 31.9* 31.9*  PLT 131* 127* 158 150    COAGS: No results for input(s): INR,  APTT in the last 8760 hours.  BMP: Recent Labs    02/14/20 1040 11/21/20 1121 01/01/21 1200 01/29/21 1050  NA 146* 139 142 140  K 4.7 4.6 4.4 4.9  CL 111 111 110 111  CO2 '25 24 24 24  ' GLUCOSE 111* 141* 190* 152*  BUN 14 20 42* 39*  CALCIUM 9.7 9.8 10.1 10.6*  CREATININE 2.41* 2.71* 2.55* 2.16*  GFRNONAA 23* 22* 23* 28*  GFRAA 27*  --   --   --     LIVER FUNCTION TESTS: Recent Labs    02/14/20 1040 11/21/20 1121 01/29/21 1050  BILITOT 0.6 0.6 0.6  AST '23 19 23  ' ALT '14 10 23  ' ALKPHOS 80 79 59  PROT 7.3 7.3 7.1  ALBUMIN 3.8 3.9 4.1    TUMOR MARKERS: No results for input(s): AFPTM, CEA, CA199, CHROMGRNA  in the last 8760 hours.  Assessment and Plan: 87 y.o. male with longstanding history of a serum monoclonal IgG kappa protein who now presents with elevation of serum free kappa light chain and 24-hour urine kappa light chain, mildly elevated calcium, hypertension, renal insufficiency ,subtle lytic lesions in the calvarium and proximal left femur on bone scan and macrocytic anemia.  He is scheduled today for CT-guided bone marrow biopsy to rule out development of progressive multiple myeloma.Risks and benefits of procedure was discussed with the patient  including, but not limited to bleeding, infection, damage to adjacent structures or low yield requiring additional tests.  All of the questions were answered and there is agreement to proceed.  Consent signed and in chart.     Thank you for this interesting consult.  I greatly enjoyed meeting Rodney Elliott and look forward to participating in their care.  A copy of this report was sent to the requesting provider on this date.  Electronically Signed: D. Rowe Robert, PA-C 02/12/2021, 9:24 AM   I spent a total of 20 minutes    in face to face in clinical consultation, greater than 50% of which was counseling/coordinating care for CT-guided bone marrow biopsy

## 2021-02-14 ENCOUNTER — Inpatient Hospital Stay: Payer: Medicare Other | Attending: Oncology

## 2021-02-14 ENCOUNTER — Other Ambulatory Visit: Payer: Self-pay

## 2021-02-14 ENCOUNTER — Ambulatory Visit: Payer: Medicare Other | Attending: Internal Medicine

## 2021-02-14 ENCOUNTER — Encounter: Payer: Self-pay | Admitting: Oncology

## 2021-02-14 ENCOUNTER — Inpatient Hospital Stay (HOSPITAL_BASED_OUTPATIENT_CLINIC_OR_DEPARTMENT_OTHER): Payer: Medicare Other | Admitting: Oncology

## 2021-02-14 ENCOUNTER — Other Ambulatory Visit (HOSPITAL_BASED_OUTPATIENT_CLINIC_OR_DEPARTMENT_OTHER): Payer: Self-pay

## 2021-02-14 VITALS — BP 111/62 | HR 88 | Temp 98.1°F | Resp 18 | Ht 68.0 in | Wt 140.2 lb

## 2021-02-14 DIAGNOSIS — D472 Monoclonal gammopathy: Secondary | ICD-10-CM | POA: Insufficient documentation

## 2021-02-14 DIAGNOSIS — I10 Essential (primary) hypertension: Secondary | ICD-10-CM | POA: Diagnosis not present

## 2021-02-14 DIAGNOSIS — N289 Disorder of kidney and ureter, unspecified: Secondary | ICD-10-CM | POA: Diagnosis not present

## 2021-02-14 DIAGNOSIS — D539 Nutritional anemia, unspecified: Secondary | ICD-10-CM | POA: Diagnosis not present

## 2021-02-14 DIAGNOSIS — Z79899 Other long term (current) drug therapy: Secondary | ICD-10-CM | POA: Insufficient documentation

## 2021-02-14 DIAGNOSIS — D594 Other nonautoimmune hemolytic anemias: Secondary | ICD-10-CM | POA: Diagnosis not present

## 2021-02-14 DIAGNOSIS — Z23 Encounter for immunization: Secondary | ICD-10-CM

## 2021-02-14 LAB — BASIC METABOLIC PANEL - CANCER CENTER ONLY
Anion gap: 8 (ref 5–15)
BUN: 39 mg/dL — ABNORMAL HIGH (ref 8–23)
CO2: 23 mmol/L (ref 22–32)
Calcium: 10.2 mg/dL (ref 8.9–10.3)
Chloride: 110 mmol/L (ref 98–111)
Creatinine: 2.3 mg/dL — ABNORMAL HIGH (ref 0.61–1.24)
GFR, Estimated: 26 mL/min — ABNORMAL LOW (ref >60)
Glucose, Bld: 172 mg/dL — ABNORMAL HIGH (ref 70–99)
Potassium: 4.4 mmol/L (ref 3.5–5.1)
Sodium: 141 mmol/L (ref 135–145)

## 2021-02-14 LAB — SURGICAL PATHOLOGY

## 2021-02-14 MED ORDER — PFIZER-BIONT COVID-19 VAC-TRIS 30 MCG/0.3ML IM SUSP
INTRAMUSCULAR | 0 refills | Status: DC
  Filled 2021-02-14: qty 0.3, 1d supply, fill #0

## 2021-02-14 NOTE — Progress Notes (Signed)
   Covid-19 Vaccination Clinic  Name:  GILBERTO STANFORTH    MRN: [id number] DOB: [birth year]  02/14/2021  Mr. Burgener was observed post Covid-19 immunization for 15 minutes without incident. He was provided with Vaccine Information Sheet and instruction to access the V-Safe system.   Mr. Sapp was instructed to call 911 with any severe reactions post vaccine: Marland Kitchen Difficulty breathing  . Swelling of face and throat  . A fast heartbeat  . A bad rash all over body  . Dizziness and weakness   Immunizations Administered    Name Date Dose VIS Date Route   PFIZER Comrnaty(Gray TOP) Covid-19 Vaccine 02/14/2021 12:35 PM 0.3 mL 09/14/2020 Intramuscular   Manufacturer: [employer]   Lot: [id number]   NDC: [phone]

## 2021-02-14 NOTE — Progress Notes (Signed)
[place] OFFICE PROGRESS NOTE   Diagnosis: Monoclonal gammopathy  INTERVAL HISTORY:   Mr. Hayduk underwent a bone marrow biopsy on 02/12/2021.  He reports tolerating the procedure well.  No new complaint.  Objective:  Vital signs in last 24 hours:  Blood pressure 111/62, pulse 88, temperature 98.1 F (36.7 C), temperature source Oral, resp. rate 18, height 5' 8" (1.727 m), weight 140 lb 3.2 oz (63.6 kg), SpO2 100 %.   Resp: Decreased breath sounds at the left compared to the right posterior chest, no respiratory distress Cardio: Regular rate and rhythm GI: No hepatosplenomegaly Vascular: No leg edema  Skin: Left iliac bone marrow biopsy site without evidence of infection or bleeding    Lab Results:  Lab Results  Component Value Date   WBC 5.0 02/12/2021   HGB 11.8 (L) 02/12/2021   HCT 35.1 (L) 02/12/2021   MCV 107.3 (H) 02/12/2021   PLT 145 (L) 02/12/2021   NEUTROABS 3.6 02/12/2021    CMP  Lab Results  Component Value Date   NA 141 02/14/2021   K 4.4 02/14/2021   CL 110 02/14/2021   CO2 23 02/14/2021   GLUCOSE 172 (H) 02/14/2021   BUN 39 (H) 02/14/2021   CREATININE 2.30 (H) 02/14/2021   CALCIUM 10.2 02/14/2021   PROT 7.1 01/29/2021   ALBUMIN 4.1 01/29/2021   AST 23 01/29/2021   ALT 23 01/29/2021   ALKPHOS 59 01/29/2021   BILITOT 0.6 01/29/2021   GFRNONAA 26 (L) 02/14/2021   GFRAA 27 (L) 02/14/2020    Imaging:  CT BIOPSY  Result Date: 02/12/2021 INDICATION: Evaluate for multiple myeloma. Please perform CT-guided bone marrow biopsy for tissue diagnostic purposes. EXAM: CT-GUIDED BONE MARROW BIOPSY AND ASPIRATION MEDICATIONS: None ANESTHESIA/SEDATION: Fentanyl 100 mcg IV; Versed 1 mg IV Sedation Time: 10 Minutes; The patient was continuously monitored during the procedure by the interventional radiology nurse under my direct supervision. COMPLICATIONS: None immediate. PROCEDURE: Informed consent was obtained from the patient following an  explanation of the procedure, risks, benefits and alternatives. The patient understands, agrees and consents for the procedure. All questions were addressed. A time out was performed prior to the initiation of the procedure. The patient was positioned prone and non-contrast localization CT was performed of the pelvis to demonstrate the iliac marrow spaces. The operative site was prepped and draped in the usual sterile fashion. Under sterile conditions and local anesthesia, a 22 gauge spinal needle was utilized for procedural planning. Next, an 11 gauge coaxial bone biopsy needle was advanced into the left iliac marrow space. Needle position was confirmed with CT imaging. Initially, a bone marrow aspiration was performed. Next, a bone marrow biopsy was obtained with the 11 gauge outer bone marrow device. Samples were prepared with the cytotechnologist and deemed adequate. The needle was removed and superficial hemostasis was obtained with manual compression. A dressing was applied. The patient tolerated the procedure well without immediate post procedural complication. IMPRESSION: Successful CT guided left iliac bone marrow aspiration and core biopsy. Electronically Signed   By: Sandi Mariscal M.D.   On: 02/12/2021 12:33   CT BONE MARROW BIOPSY & ASPIRATION  Result Date: 02/12/2021 INDICATION: Evaluate for multiple myeloma. Please perform CT-guided bone marrow biopsy for tissue diagnostic purposes. EXAM: CT-GUIDED BONE MARROW BIOPSY AND ASPIRATION MEDICATIONS: None ANESTHESIA/SEDATION: Fentanyl 100 mcg IV; Versed 1 mg IV Sedation Time: 10 Minutes; The patient was continuously monitored during the procedure by the interventional radiology nurse under my direct supervision. COMPLICATIONS: None immediate.  PROCEDURE: Informed consent was obtained from the patient following an explanation of the procedure, risks, benefits and alternatives. The patient understands, agrees and consents for the procedure. All questions were  addressed. A time out was performed prior to the initiation of the procedure. The patient was positioned prone and non-contrast localization CT was performed of the pelvis to demonstrate the iliac marrow spaces. The operative site was prepped and draped in the usual sterile fashion. Under sterile conditions and local anesthesia, a 22 gauge spinal needle was utilized for procedural planning. Next, an 11 gauge coaxial bone biopsy needle was advanced into the left iliac marrow space. Needle position was confirmed with CT imaging. Initially, a bone marrow aspiration was performed. Next, a bone marrow biopsy was obtained with the 11 gauge outer bone marrow device. Samples were prepared with the cytotechnologist and deemed adequate. The needle was removed and superficial hemostasis was obtained with manual compression. A dressing was applied. The patient tolerated the procedure well without immediate post procedural complication. IMPRESSION: Successful CT guided left iliac bone marrow aspiration and core biopsy. Electronically Signed   By: Sandi Mariscal M.D.   On: 02/12/2021 12:33    Medications: I have reviewed the patient's current medications.   Assessment/Plan: 1. Macrocytic anemia  2. Renal insufficiency  3. Serum monoclonal IgG kappa protein  Bone survey 12/29/2020-subtle lytic lesions in the calvarium and proximal left femur  Progressive elevation of free kappa light chains and increased kappa light chain proteinuria March 2022  Bone marrow biopsy 02/12/2021- 5- 7% plasma cells, kappa light chain restricted  4. Hypertension  5. Idiopathic nonimmune hemolytic anemia/thrombocytopenia in 2014, spontaneously resolved    Disposition: Mr. Maye appears unchanged.  He underwent a bone marrow biopsy 02/12/2021.  I discussed the bone marrow findings with the pathologist today.  The preliminary review is consistent with involvement by kappa light chain restricted plasma cells compromising 5-7%  of the bone marrow population.  A myeloma FISH panel is pending.  I discussed the bone marrow finding with Mr. Paule.  He does not appear to have overt myeloma.  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  He is stable from a hematologic standpoint.  He has stable renal insufficiency.  The calcium level is normal today.  We decided to continue observation.  He will return for an office and lab visit in 2 months.  Mr. Kesinger will receive a second COVID-19 booster vaccine today.  Betsy Coder, MD  02/14/2021  1:02 PM

## 2021-02-23 ENCOUNTER — Encounter (HOSPITAL_COMMUNITY): Payer: Self-pay | Admitting: Oncology

## 2021-02-28 LAB — SURGICAL PATHOLOGY

## 2021-03-06 ENCOUNTER — Other Ambulatory Visit: Payer: Medicare Other

## 2021-03-06 ENCOUNTER — Ambulatory Visit: Payer: Medicare Other | Admitting: Oncology

## 2021-04-16 ENCOUNTER — Other Ambulatory Visit: Payer: Self-pay

## 2021-04-16 ENCOUNTER — Inpatient Hospital Stay: Payer: Medicare Other | Attending: Oncology | Admitting: Oncology

## 2021-04-16 ENCOUNTER — Inpatient Hospital Stay: Payer: Medicare Other

## 2021-04-16 VITALS — BP 141/74 | HR 89 | Temp 97.8°F | Resp 19 | Ht 68.0 in | Wt 141.8 lb

## 2021-04-16 DIAGNOSIS — N289 Disorder of kidney and ureter, unspecified: Secondary | ICD-10-CM | POA: Insufficient documentation

## 2021-04-16 DIAGNOSIS — Z79899 Other long term (current) drug therapy: Secondary | ICD-10-CM | POA: Insufficient documentation

## 2021-04-16 DIAGNOSIS — D472 Monoclonal gammopathy: Secondary | ICD-10-CM | POA: Diagnosis not present

## 2021-04-16 DIAGNOSIS — D539 Nutritional anemia, unspecified: Secondary | ICD-10-CM | POA: Insufficient documentation

## 2021-04-16 DIAGNOSIS — I10 Essential (primary) hypertension: Secondary | ICD-10-CM | POA: Diagnosis not present

## 2021-04-16 LAB — CMP (CANCER CENTER ONLY)
ALT: 21 U/L (ref 0–44)
AST: 23 U/L (ref 15–41)
Albumin: 4 g/dL (ref 3.5–5.0)
Alkaline Phosphatase: 61 U/L (ref 38–126)
Anion gap: 6 (ref 5–15)
BUN: 39 mg/dL — ABNORMAL HIGH (ref 8–23)
CO2: 27 mmol/L (ref 22–32)
Calcium: 10.2 mg/dL (ref 8.9–10.3)
Chloride: 110 mmol/L (ref 98–111)
Creatinine: 2.27 mg/dL — ABNORMAL HIGH (ref 0.61–1.24)
GFR, Estimated: 27 mL/min — ABNORMAL LOW (ref >60)
Glucose, Bld: 190 mg/dL — ABNORMAL HIGH (ref 70–99)
Potassium: 5 mmol/L (ref 3.5–5.1)
Sodium: 143 mmol/L (ref 135–145)
Total Bilirubin: 0.6 mg/dL (ref 0.3–1.2)
Total Protein: 7.1 g/dL (ref 6.5–8.1)

## 2021-04-16 LAB — CBC WITH DIFFERENTIAL (CANCER CENTER ONLY)
Abs Immature Granulocytes: 0 10*3/uL (ref 0.00–0.07)
Basophils Absolute: 0 10*3/uL (ref 0.0–0.1)
Basophils Relative: 1 %
Eosinophils Absolute: 0.1 10*3/uL (ref 0.0–0.5)
Eosinophils Relative: 4 %
HCT: 32.7 % — ABNORMAL LOW (ref 39.0–52.0)
Hemoglobin: 11.1 g/dL — ABNORMAL LOW (ref 13.0–17.0)
Immature Granulocytes: 0 %
Lymphocytes Relative: 32 %
Lymphs Abs: 1 10*3/uL (ref 0.7–4.0)
MCH: 36.4 pg — ABNORMAL HIGH (ref 26.0–34.0)
MCHC: 33.9 g/dL (ref 30.0–36.0)
MCV: 107.2 fL — ABNORMAL HIGH (ref 80.0–100.0)
Monocytes Absolute: 0.2 10*3/uL (ref 0.1–1.0)
Monocytes Relative: 7 %
Neutro Abs: 1.7 10*3/uL (ref 1.7–7.7)
Neutrophils Relative %: 56 %
Platelet Count: 141 10*3/uL — ABNORMAL LOW (ref 150–400)
RBC: 3.05 MIL/uL — ABNORMAL LOW (ref 4.22–5.81)
RDW: 11.9 % (ref 11.5–15.5)
WBC Count: 3.1 10*3/uL — ABNORMAL LOW (ref 4.0–10.5)
nRBC: 0 % (ref 0.0–0.2)

## 2021-04-16 NOTE — Progress Notes (Signed)
  [place] OFFICE PROGRESS NOTE   Diagnosis: Serum monoclonal protein  INTERVAL HISTORY:   Rodney Elliott returns as scheduled.  He feels well.  No complaint.  Objective:  Vital signs in last 24 hours:  Blood pressure (!) 141/74, pulse 89, temperature 97.8 F (36.6 C), temperature source Oral, resp. rate 19, height 5' 8" (1.727 m), weight 141 lb 12.8 oz (64.3 kg), SpO2 100 %.   Lymphatics: No cervical, supraclavicular, axillary, or inguinal nodes Resp: Lungs clear bilaterally Cardio: Regular rate and rhythm GI: No mass, nontender, reducible left inguinal hernia Vascular: No leg edema   Lab Results:  Lab Results  Component Value Date   WBC 3.1 (L) 04/16/2021   HGB 11.1 (L) 04/16/2021   HCT 32.7 (L) 04/16/2021   MCV 107.2 (H) 04/16/2021   PLT 141 (L) 04/16/2021   NEUTROABS 1.7 04/16/2021    CMP  Lab Results  Component Value Date   NA 143 04/16/2021   K 5.0 04/16/2021   CL 110 04/16/2021   CO2 27 04/16/2021   GLUCOSE 190 (H) 04/16/2021   BUN 39 (H) 04/16/2021   CREATININE 2.27 (H) 04/16/2021   CALCIUM 10.2 04/16/2021   PROT 7.1 04/16/2021   ALBUMIN 4.0 04/16/2021   AST 23 04/16/2021   ALT 21 04/16/2021   ALKPHOS 61 04/16/2021   BILITOT 0.6 04/16/2021   GFRNONAA 27 (L) 04/16/2021   GFRAA 27 (L) 02/14/2020     Medications: I have reviewed the patient's current medications.   Assessment/Plan: Macrocytic anemia   Renal insufficiency   3.   Serum monoclonal IgG kappa protein Bone survey 12/29/2020-subtle lytic lesions in the calvarium and proximal left femur Progressive elevation of free kappa light chains and increased kappa light chain proteinuria March 2022 Bone marrow biopsy 02/12/2021- 5- 7% plasma cells, kappa light chain restricted, 46 X,-Y karyotype   4.    Hypertension   5.    Idiopathic nonimmune hemolytic anemia/thrombocytopenia in 2014, spontaneously resolved     Disposition: Rodney Elliott appears stable.  There is no clinical or  laboratory evidence for progression of myeloma.  We will follow-up on the myeloma panel from today.  He will return for an office and lab visit in 2-1/2 months.    Betsy Coder, MD  04/16/2021  12:14 PM

## 2021-04-17 LAB — KAPPA/LAMBDA LIGHT CHAINS
Kappa free light chain: 481.2 mg/L — ABNORMAL HIGH (ref 3.3–19.4)
Kappa, lambda light chain ratio: 17.06 — ABNORMAL HIGH (ref 0.26–1.65)
Lambda free light chains: 28.2 mg/L — ABNORMAL HIGH (ref 5.7–26.3)

## 2021-04-17 LAB — IGG: IgG (Immunoglobin G), Serum: 1385 mg/dL (ref 603–1613)

## 2021-04-18 LAB — PROTEIN ELECTROPHORESIS, SERUM
A/G Ratio: 1.3 (ref 0.7–1.7)
Albumin ELP: 3.8 g/dL (ref 2.9–4.4)
Alpha-1-Globulin: 0.2 g/dL (ref 0.0–0.4)
Alpha-2-Globulin: 0.5 g/dL (ref 0.4–1.0)
Beta Globulin: 1.1 g/dL (ref 0.7–1.3)
Gamma Globulin: 1.2 g/dL (ref 0.4–1.8)
Globulin, Total: 3 g/dL (ref 2.2–3.9)
M-Spike, %: 0.8 g/dL — ABNORMAL HIGH
Total Protein ELP: 6.8 g/dL (ref 6.0–8.5)

## 2021-06-25 ENCOUNTER — Other Ambulatory Visit: Payer: Self-pay

## 2021-06-25 ENCOUNTER — Inpatient Hospital Stay: Payer: Medicare Other

## 2021-06-25 ENCOUNTER — Inpatient Hospital Stay: Payer: Medicare Other | Attending: Oncology | Admitting: Oncology

## 2021-06-25 VITALS — BP 155/75 | HR 87 | Temp 98.2°F | Resp 19 | Ht 68.0 in | Wt 139.8 lb

## 2021-06-25 DIAGNOSIS — D472 Monoclonal gammopathy: Secondary | ICD-10-CM | POA: Diagnosis not present

## 2021-06-25 DIAGNOSIS — N289 Disorder of kidney and ureter, unspecified: Secondary | ICD-10-CM | POA: Diagnosis not present

## 2021-06-25 DIAGNOSIS — Z79899 Other long term (current) drug therapy: Secondary | ICD-10-CM | POA: Diagnosis not present

## 2021-06-25 DIAGNOSIS — D539 Nutritional anemia, unspecified: Secondary | ICD-10-CM | POA: Insufficient documentation

## 2021-06-25 DIAGNOSIS — I10 Essential (primary) hypertension: Secondary | ICD-10-CM | POA: Diagnosis not present

## 2021-06-25 LAB — CMP (CANCER CENTER ONLY)
ALT: 16 U/L (ref 0–44)
AST: 20 U/L (ref 15–41)
Albumin: 4 g/dL (ref 3.5–5.0)
Alkaline Phosphatase: 54 U/L (ref 38–126)
Anion gap: 7 (ref 5–15)
BUN: 41 mg/dL — ABNORMAL HIGH (ref 8–23)
CO2: 26 mmol/L (ref 22–32)
Calcium: 10.5 mg/dL — ABNORMAL HIGH (ref 8.9–10.3)
Chloride: 110 mmol/L (ref 98–111)
Creatinine: 2.24 mg/dL — ABNORMAL HIGH (ref 0.61–1.24)
GFR, Estimated: 27 mL/min — ABNORMAL LOW (ref >60)
Glucose, Bld: 192 mg/dL — ABNORMAL HIGH (ref 70–99)
Potassium: 4.8 mmol/L (ref 3.5–5.1)
Sodium: 143 mmol/L (ref 135–145)
Total Bilirubin: 0.8 mg/dL (ref 0.3–1.2)
Total Protein: 7.1 g/dL (ref 6.5–8.1)

## 2021-06-25 LAB — CBC WITH DIFFERENTIAL (CANCER CENTER ONLY)
Abs Immature Granulocytes: 0.01 10*3/uL (ref 0.00–0.07)
Basophils Absolute: 0 10*3/uL (ref 0.0–0.1)
Basophils Relative: 1 %
Eosinophils Absolute: 0.1 10*3/uL (ref 0.0–0.5)
Eosinophils Relative: 2 %
HCT: 31.2 % — ABNORMAL LOW (ref 39.0–52.0)
Hemoglobin: 10.8 g/dL — ABNORMAL LOW (ref 13.0–17.0)
Immature Granulocytes: 0 %
Lymphocytes Relative: 25 %
Lymphs Abs: 0.9 10*3/uL (ref 0.7–4.0)
MCH: 36.5 pg — ABNORMAL HIGH (ref 26.0–34.0)
MCHC: 34.6 g/dL (ref 30.0–36.0)
MCV: 105.4 fL — ABNORMAL HIGH (ref 80.0–100.0)
Monocytes Absolute: 0.2 10*3/uL (ref 0.1–1.0)
Monocytes Relative: 5 %
Neutro Abs: 2.3 10*3/uL (ref 1.7–7.7)
Neutrophils Relative %: 67 %
Platelet Count: 134 10*3/uL — ABNORMAL LOW (ref 150–400)
RBC: 2.96 MIL/uL — ABNORMAL LOW (ref 4.22–5.81)
RDW: 12.1 % (ref 11.5–15.5)
WBC Count: 3.5 10*3/uL — ABNORMAL LOW (ref 4.0–10.5)
nRBC: 0 % (ref 0.0–0.2)

## 2021-06-25 NOTE — Progress Notes (Signed)
  [place] OFFICE PROGRESS NOTE   Diagnosis: Serum monoclonal protein  INTERVAL HISTORY:   Rodney Elliott returns as scheduled.  No new complaint.  No recent infection.  No pain.  He has mild malaise.  Objective:  Vital signs in last 24 hours:  Blood pressure (!) 155/75, pulse 87, temperature 98.2 F (36.8 C), temperature source Oral, resp. rate 19, height $RemoveBe'5\' 8"'wyMEnmwZk$  (1.727 m), weight 139 lb 12.8 oz (63.4 kg), SpO2 100 %.   Lymphatics: No cervical, supraclavicular, or axillary nodes Resp: Lungs with decreased breath sounds at the left posterior base, no respiratory distress Cardio: Regular rate and rhythm GI: No hepatosplenomegaly Vascular: No leg edema   Lab Results:  Lab Results  Component Value Date   WBC 3.5 (L) 06/25/2021   HGB 10.8 (L) 06/25/2021   HCT 31.2 (L) 06/25/2021   MCV 105.4 (H) 06/25/2021   PLT 134 (L) 06/25/2021   NEUTROABS 2.3 06/25/2021    CMP  Lab Results  Component Value Date   NA 143 04/16/2021   K 5.0 04/16/2021   CL 110 04/16/2021   CO2 27 04/16/2021   GLUCOSE 190 (H) 04/16/2021   BUN 39 (H) 04/16/2021   CREATININE 2.27 (H) 04/16/2021   CALCIUM 10.2 04/16/2021   PROT 7.1 04/16/2021   ALBUMIN 4.0 04/16/2021   AST 23 04/16/2021   ALT 21 04/16/2021   ALKPHOS 61 04/16/2021   BILITOT 0.6 04/16/2021   GFRNONAA 27 (L) 04/16/2021   GFRAA 27 (L) 02/14/2020     Medications: I have reviewed the patient's current medications.   Assessment/Plan: Macrocytic anemia   Renal insufficiency   3.   Serum monoclonal IgG kappa protein Bone survey 12/29/2020-subtle lytic lesions in the calvarium and proximal left femur Progressive elevation of free kappa light chains and increased kappa light chain proteinuria March 2022 Bone marrow biopsy 02/12/2021- 5- 7% plasma cells, kappa light chain restricted, 46 X,-Y karyotype   4.    Hypertension   5.    Idiopathic nonimmune hemolytic anemia/thrombocytopenia in 2014, spontaneously  resolved     Disposition: Rodney Elliott appears unchanged.  He is stable from a hematologic standpoint.  The myeloma panel was stable in July.  We will follow-up on the creatinine and myeloma panel from today.  He will return for an office and lab visit in 3 months.  He plans to obtain an influenza vaccine within the next few months.    Betsy Coder, MD  06/25/2021  11:45 AM

## 2021-06-26 LAB — KAPPA/LAMBDA LIGHT CHAINS
Kappa free light chain: 684.7 mg/L — ABNORMAL HIGH (ref 3.3–19.4)
Kappa, lambda light chain ratio: 28.06 — ABNORMAL HIGH (ref 0.26–1.65)
Lambda free light chains: 24.4 mg/L (ref 5.7–26.3)

## 2021-06-26 LAB — PROTEIN ELECTROPHORESIS, SERUM
A/G Ratio: 1.2 (ref 0.7–1.7)
Albumin ELP: 3.8 g/dL (ref 2.9–4.4)
Alpha-1-Globulin: 0.2 g/dL (ref 0.0–0.4)
Alpha-2-Globulin: 0.5 g/dL (ref 0.4–1.0)
Beta Globulin: 1.2 g/dL (ref 0.7–1.3)
Gamma Globulin: 1.3 g/dL (ref 0.4–1.8)
Globulin, Total: 3.2 g/dL (ref 2.2–3.9)
M-Spike, %: 0.7 g/dL — ABNORMAL HIGH
Total Protein ELP: 7 g/dL (ref 6.0–8.5)

## 2021-06-26 LAB — IGG: IgG (Immunoglobin G), Serum: 1344 mg/dL (ref 603–1613)

## 2021-06-27 ENCOUNTER — Telehealth: Payer: Self-pay

## 2021-06-27 NOTE — Telephone Encounter (Signed)
-----   Message from Ladell Pier, MD sent at 06/27/2021  7:01 AM EDT ----- Please call patient, labs are stable, f/u as scheduled

## 2021-06-27 NOTE — Telephone Encounter (Signed)
Message relayed to Pt. Pt verbalized understanding.

## 2021-08-10 ENCOUNTER — Other Ambulatory Visit: Payer: Self-pay | Admitting: Internal Medicine

## 2021-08-10 DIAGNOSIS — Z0000 Encounter for general adult medical examination without abnormal findings: Secondary | ICD-10-CM

## 2021-08-15 ENCOUNTER — Ambulatory Visit
Admission: RE | Admit: 2021-08-15 | Discharge: 2021-08-15 | Disposition: A | Discharge status: Home / Self Care | Payer: Medicare Other | Source: Ambulatory Visit | Attending: Internal Medicine | Admitting: Internal Medicine

## 2021-08-15 ENCOUNTER — Other Ambulatory Visit: Payer: Medicare Other

## 2021-08-15 ENCOUNTER — Other Ambulatory Visit: Payer: Self-pay | Admitting: Internal Medicine

## 2021-08-15 DIAGNOSIS — Z0000 Encounter for general adult medical examination without abnormal findings: Secondary | ICD-10-CM

## 2021-09-17 ENCOUNTER — Inpatient Hospital Stay: Payer: Medicare Other

## 2021-09-17 ENCOUNTER — Inpatient Hospital Stay: Payer: Medicare Other | Admitting: Oncology

## 2021-09-19 ENCOUNTER — Telehealth: Payer: Self-pay | Admitting: Oncology

## 2021-09-20 ENCOUNTER — Inpatient Hospital Stay: Payer: Medicare Other | Admitting: Oncology

## 2021-09-20 ENCOUNTER — Inpatient Hospital Stay: Payer: Medicare Other

## 2021-09-26 ENCOUNTER — Ambulatory Visit: Payer: Medicare Other | Admitting: Oncology

## 2021-09-26 ENCOUNTER — Other Ambulatory Visit: Payer: Medicare Other

## 2021-10-26 ENCOUNTER — Other Ambulatory Visit: Payer: Self-pay

## 2021-10-26 ENCOUNTER — Inpatient Hospital Stay: Payer: Medicare Other | Attending: Oncology

## 2021-10-26 ENCOUNTER — Inpatient Hospital Stay (HOSPITAL_BASED_OUTPATIENT_CLINIC_OR_DEPARTMENT_OTHER): Payer: Medicare Other | Admitting: Oncology

## 2021-10-26 VITALS — BP 150/79 | HR 78 | Temp 97.8°F | Resp 18 | Ht 68.0 in | Wt 142.6 lb

## 2021-10-26 DIAGNOSIS — I10 Essential (primary) hypertension: Secondary | ICD-10-CM | POA: Insufficient documentation

## 2021-10-26 DIAGNOSIS — D472 Monoclonal gammopathy: Secondary | ICD-10-CM | POA: Diagnosis not present

## 2021-10-26 DIAGNOSIS — N289 Disorder of kidney and ureter, unspecified: Secondary | ICD-10-CM | POA: Diagnosis not present

## 2021-10-26 DIAGNOSIS — D539 Nutritional anemia, unspecified: Secondary | ICD-10-CM | POA: Insufficient documentation

## 2021-10-26 DIAGNOSIS — Z79899 Other long term (current) drug therapy: Secondary | ICD-10-CM | POA: Diagnosis not present

## 2021-10-26 LAB — CMP (CANCER CENTER ONLY)
ALT: 30 U/L (ref 0–44)
AST: 23 U/L (ref 15–41)
Albumin: 4 g/dL (ref 3.5–5.0)
Alkaline Phosphatase: 56 U/L (ref 38–126)
Anion gap: 7 (ref 5–15)
BUN: 36 mg/dL — ABNORMAL HIGH (ref 8–23)
CO2: 26 mmol/L (ref 22–32)
Calcium: 10.8 mg/dL — ABNORMAL HIGH (ref 8.9–10.3)
Chloride: 110 mmol/L (ref 98–111)
Creatinine: 2.14 mg/dL — ABNORMAL HIGH (ref 0.61–1.24)
GFR, Estimated: 29 mL/min — ABNORMAL LOW (ref >60)
Glucose, Bld: 181 mg/dL — ABNORMAL HIGH (ref 70–99)
Potassium: 4.7 mmol/L (ref 3.5–5.1)
Sodium: 143 mmol/L (ref 135–145)
Total Bilirubin: 0.8 mg/dL (ref 0.3–1.2)
Total Protein: 7.4 g/dL (ref 6.5–8.1)

## 2021-10-26 LAB — CBC WITH DIFFERENTIAL (CANCER CENTER ONLY)
Abs Immature Granulocytes: 0 10*3/uL (ref 0.00–0.07)
Basophils Absolute: 0 10*3/uL (ref 0.0–0.1)
Basophils Relative: 1 %
Eosinophils Absolute: 0.1 10*3/uL (ref 0.0–0.5)
Eosinophils Relative: 4 %
HCT: 33.1 % — ABNORMAL LOW (ref 39.0–52.0)
Hemoglobin: 11.5 g/dL — ABNORMAL LOW (ref 13.0–17.0)
Immature Granulocytes: 0 %
Lymphocytes Relative: 24 %
Lymphs Abs: 0.8 10*3/uL (ref 0.7–4.0)
MCH: 36.7 pg — ABNORMAL HIGH (ref 26.0–34.0)
MCHC: 34.7 g/dL (ref 30.0–36.0)
MCV: 105.8 fL — ABNORMAL HIGH (ref 80.0–100.0)
Monocytes Absolute: 0.2 10*3/uL (ref 0.1–1.0)
Monocytes Relative: 6 %
Neutro Abs: 2.2 10*3/uL (ref 1.7–7.7)
Neutrophils Relative %: 65 %
Platelet Count: 154 10*3/uL (ref 150–400)
RBC: 3.13 MIL/uL — ABNORMAL LOW (ref 4.22–5.81)
RDW: 13 % (ref 11.5–15.5)
WBC Count: 3.4 10*3/uL — ABNORMAL LOW (ref 4.0–10.5)
nRBC: 0 % (ref 0.0–0.2)

## 2021-10-26 NOTE — Progress Notes (Signed)
[place] OFFICE PROGRESS NOTE   Diagnosis: Monoclonal protein  INTERVAL HISTORY:   Rodney Elliott returns as scheduled.  He feels well.  He had an upper respiratory infection during the holidays.  No complaint today.  Objective:  Vital signs in last 24 hours:  Blood pressure (!) 150/79, pulse 78, temperature 97.8 F (36.6 C), temperature source Oral, resp. rate 18, height _0  (1.727 m), weight 142 lb 9.6 oz (64.7 kg), SpO2 100 %.   Lymphatics: No cervical, supraclavicular, axillary, or inguinal nodes Resp: Lungs with decreased breath sounds at the left lower posterior chest, no respiratory distress Cardio: Regular rate and rhythm GI: No hepatosplenomegaly Vascular: No leg edema   Lab Results:  Lab Results  Component Value Date   WBC 3.4 (L) 10/26/2021   HGB 11.5 (L) 10/26/2021   HCT 33.1 (L) 10/26/2021   MCV 105.8 (H) 10/26/2021   PLT 154 10/26/2021   NEUTROABS 2.2 10/26/2021    CMP  Lab Results  Component Value Date   NA 143 10/26/2021   K 4.7 10/26/2021   CL 110 10/26/2021   CO2 26 10/26/2021   GLUCOSE 181 (H) 10/26/2021   BUN 36 (H) 10/26/2021   CREATININE 2.14 (H) 10/26/2021   CALCIUM 10.8 (H) 10/26/2021   PROT 7.4 10/26/2021   ALBUMIN 4.0 10/26/2021   AST 23 10/26/2021   ALT 30 10/26/2021   ALKPHOS 56 10/26/2021   BILITOT 0.8 10/26/2021   GFRNONAA 29 (L) 10/26/2021   GFRAA 27 (L) 02/14/2020    No results found for: CEA1, CEA, CAN199, CA125  No results found for: INR, LABPROT  Imaging:  No results found.  Medications: I have reviewed the patient's current medications.   Assessment/Plan: Macrocytic anemia   Renal insufficiency   3.   Serum monoclonal IgG kappa protein Bone survey 12/29/2020-subtle lytic lesions in the calvarium and proximal left femur Progressive elevation of free kappa light chains and increased kappa light chain proteinuria March 2022 Bone marrow biopsy 02/12/2021- 5- 7% plasma cells, kappa light chain  restricted, 46 X,-Y karyotype   4.    Hypertension   5.    Idiopathic nonimmune hemolytic anemia/thrombocytopenia in 2014, spontaneously resolved   Disposition: [name] appears stable.  The serum M spike and IgG were stable when he was here in September.  The free kappa light chains were higher.  We will follow-up on the myeloma panel from today.  He is stable from a hematologic standpoint.  The creatinine is stable.  The calcium is mildly elevated.  The calcium has been mildly elevated in the past.  I recommended he call for somnolence or confusion.  He will return for a chemistry panel in 6 weeks.  I doubt the elevated calcium is related to hypercalcemia of malignancy.  Mr will be scheduled for an office visit in 4 months.  Betsy Coder, MD  10/26/2021  10:34 AM

## 2021-10-27 LAB — IGG: IgG (Immunoglobin G), Serum: 1477 mg/dL (ref 603–1613)

## 2021-10-29 LAB — KAPPA/LAMBDA LIGHT CHAINS
Kappa free light chain: 902.5 mg/L — ABNORMAL HIGH (ref 3.3–19.4)
Kappa, lambda light chain ratio: 34.19 — ABNORMAL HIGH (ref 0.26–1.65)
Lambda free light chains: 26.4 mg/L — ABNORMAL HIGH (ref 5.7–26.3)

## 2021-10-30 LAB — PROTEIN ELECTROPHORESIS, SERUM
A/G Ratio: 1 (ref 0.7–1.7)
Albumin ELP: 3.5 g/dL (ref 2.9–4.4)
Alpha-1-Globulin: 0.3 g/dL (ref 0.0–0.4)
Alpha-2-Globulin: 0.6 g/dL (ref 0.4–1.0)
Beta Globulin: 1.3 g/dL (ref 0.7–1.3)
Gamma Globulin: 1.5 g/dL (ref 0.4–1.8)
Globulin, Total: 3.6 g/dL (ref 2.2–3.9)
M-Spike, %: 1 g/dL — ABNORMAL HIGH
Total Protein ELP: 7.1 g/dL (ref 6.0–8.5)

## 2021-11-02 ENCOUNTER — Other Ambulatory Visit: Payer: Self-pay

## 2021-11-02 ENCOUNTER — Telehealth: Payer: Self-pay

## 2021-11-02 NOTE — Telephone Encounter (Signed)
Pt verbalized understanding.

## 2021-11-02 NOTE — Telephone Encounter (Signed)
-----   Message from Ladell Pier, MD sent at 11/02/2021  2:51 PM EST ----- Please call patient, serum free light chains are slightly higher, but serum M spike has not changed significantly over the past year.  Return for lab as scheduled.  Add serum free light chains to next lab.  Follow-up for office as scheduled

## 2021-12-07 ENCOUNTER — Inpatient Hospital Stay: Payer: Medicare Other | Attending: Oncology

## 2021-12-07 ENCOUNTER — Telehealth: Payer: Self-pay

## 2021-12-07 ENCOUNTER — Other Ambulatory Visit: Payer: Self-pay

## 2021-12-07 DIAGNOSIS — D472 Monoclonal gammopathy: Secondary | ICD-10-CM | POA: Diagnosis present

## 2021-12-07 DIAGNOSIS — D539 Nutritional anemia, unspecified: Secondary | ICD-10-CM | POA: Insufficient documentation

## 2021-12-07 LAB — CMP (CANCER CENTER ONLY)
ALT: 17 U/L (ref 0–44)
AST: 23 U/L (ref 15–41)
Albumin: 4 g/dL (ref 3.5–5.0)
Alkaline Phosphatase: 55 U/L (ref 38–126)
Anion gap: 6 (ref 5–15)
BUN: 34 mg/dL — ABNORMAL HIGH (ref 8–23)
CO2: 26 mmol/L (ref 22–32)
Calcium: 10.7 mg/dL — ABNORMAL HIGH (ref 8.9–10.3)
Chloride: 110 mmol/L (ref 98–111)
Creatinine: 2.13 mg/dL — ABNORMAL HIGH (ref 0.61–1.24)
GFR, Estimated: 29 mL/min — ABNORMAL LOW (ref >60)
Glucose, Bld: 144 mg/dL — ABNORMAL HIGH (ref 70–99)
Potassium: 4.6 mmol/L (ref 3.5–5.1)
Sodium: 142 mmol/L (ref 135–145)
Total Bilirubin: 0.9 mg/dL (ref 0.3–1.2)
Total Protein: 7.2 g/dL (ref 6.5–8.1)

## 2021-12-07 NOTE — Telephone Encounter (Signed)
Pt. Verbalized understanding.

## 2021-12-07 NOTE — Telephone Encounter (Signed)
-----   Message from Ladell Pier, MD sent at 12/07/2021  1:27 PM EST ----- ?Please call patient, kidney function and calcium are stable, follow-up as scheduled ? ?

## 2022-02-22 ENCOUNTER — Telehealth: Payer: Self-pay

## 2022-02-22 ENCOUNTER — Inpatient Hospital Stay: Payer: Medicare Other

## 2022-02-22 ENCOUNTER — Inpatient Hospital Stay: Payer: Medicare Other | Attending: Oncology | Admitting: Oncology

## 2022-02-22 VITALS — BP 135/69 | HR 80 | Temp 98.2°F | Resp 18 | Ht 68.0 in | Wt 139.2 lb

## 2022-02-22 DIAGNOSIS — D472 Monoclonal gammopathy: Secondary | ICD-10-CM | POA: Insufficient documentation

## 2022-02-22 DIAGNOSIS — D539 Nutritional anemia, unspecified: Secondary | ICD-10-CM | POA: Diagnosis present

## 2022-02-22 DIAGNOSIS — N289 Disorder of kidney and ureter, unspecified: Secondary | ICD-10-CM | POA: Insufficient documentation

## 2022-02-22 DIAGNOSIS — Z79899 Other long term (current) drug therapy: Secondary | ICD-10-CM | POA: Insufficient documentation

## 2022-02-22 DIAGNOSIS — I10 Essential (primary) hypertension: Secondary | ICD-10-CM | POA: Insufficient documentation

## 2022-02-22 LAB — CBC WITH DIFFERENTIAL (CANCER CENTER ONLY)
Abs Immature Granulocytes: 0.01 10*3/uL (ref 0.00–0.07)
Basophils Absolute: 0 10*3/uL (ref 0.0–0.1)
Basophils Relative: 1 %
Eosinophils Absolute: 0.1 10*3/uL (ref 0.0–0.5)
Eosinophils Relative: 3 %
HCT: 31.3 % — ABNORMAL LOW (ref 39.0–52.0)
Hemoglobin: 10.9 g/dL — ABNORMAL LOW (ref 13.0–17.0)
Immature Granulocytes: 0 %
Lymphocytes Relative: 32 %
Lymphs Abs: 1 10*3/uL (ref 0.7–4.0)
MCH: 36.6 pg — ABNORMAL HIGH (ref 26.0–34.0)
MCHC: 34.8 g/dL (ref 30.0–36.0)
MCV: 105 fL — ABNORMAL HIGH (ref 80.0–100.0)
Monocytes Absolute: 0.2 10*3/uL (ref 0.1–1.0)
Monocytes Relative: 7 %
Neutro Abs: 1.8 10*3/uL (ref 1.7–7.7)
Neutrophils Relative %: 57 %
Platelet Count: 134 10*3/uL — ABNORMAL LOW (ref 150–400)
RBC: 2.98 MIL/uL — ABNORMAL LOW (ref 4.22–5.81)
RDW: 12.3 % (ref 11.5–15.5)
WBC Count: 3.1 10*3/uL — ABNORMAL LOW (ref 4.0–10.5)
nRBC: 0 % (ref 0.0–0.2)

## 2022-02-22 LAB — CMP (CANCER CENTER ONLY)
ALT: 15 U/L (ref 0–44)
AST: 21 U/L (ref 15–41)
Albumin: 4 g/dL (ref 3.5–5.0)
Alkaline Phosphatase: 54 U/L (ref 38–126)
Anion gap: 7 (ref 5–15)
BUN: 38 mg/dL — ABNORMAL HIGH (ref 8–23)
CO2: 27 mmol/L (ref 22–32)
Calcium: 10.8 mg/dL — ABNORMAL HIGH (ref 8.9–10.3)
Chloride: 107 mmol/L (ref 98–111)
Creatinine: 2.16 mg/dL — ABNORMAL HIGH (ref 0.61–1.24)
GFR, Estimated: 28 mL/min — ABNORMAL LOW (ref >60)
Glucose, Bld: 176 mg/dL — ABNORMAL HIGH (ref 70–99)
Potassium: 5 mmol/L (ref 3.5–5.1)
Sodium: 141 mmol/L (ref 135–145)
Total Bilirubin: 0.7 mg/dL (ref 0.3–1.2)
Total Protein: 7.5 g/dL (ref 6.5–8.1)

## 2022-02-22 NOTE — Progress Notes (Signed)
  [place] OFFICE PROGRESS NOTE   Diagnosis: Serum monoclonal protein, smoldering myeloma?  INTERVAL HISTORY:   Rodney Elliott returns as scheduled.  He feels well.  No complaint.  No pain.  No recent infection.  He reports Dr. Francesco Sor is scheduling a "bone x-ray "for next week.  Objective:  Vital signs in last 24 hours:  Blood pressure 135/69, pulse 80, temperature 98.2 F (36.8 C), temperature source Oral, resp. rate 18, height $RemoveBe'5\' 8"'RKLThAXRF$  (1.727 m), weight 139 lb 3.2 oz (63.1 kg), SpO2 100 %.    Lymphatics: No cervical, supraclavicular, axillary, or inguinal nodes Resp: Decreased breath sounds at the right lower posterior chest, no respiratory distress Cardio: Regular rhythm GI: No hepatosplenomegaly Vascular: No leg edema    Lab Results:  Lab Results  Component Value Date   WBC 3.1 (L) 02/22/2022   HGB 10.9 (L) 02/22/2022   HCT 31.3 (L) 02/22/2022   MCV 105.0 (H) 02/22/2022   PLT 134 (L) 02/22/2022   NEUTROABS 1.8 02/22/2022    CMP  Lab Results  Component Value Date   NA 141 02/22/2022   K 5.0 02/22/2022   CL 107 02/22/2022   CO2 27 02/22/2022   GLUCOSE 176 (H) 02/22/2022   BUN 38 (H) 02/22/2022   CREATININE 2.16 (H) 02/22/2022   CALCIUM 10.8 (H) 02/22/2022   PROT 7.5 02/22/2022   ALBUMIN 4.0 02/22/2022   AST 21 02/22/2022   ALT 15 02/22/2022   ALKPHOS 54 02/22/2022   BILITOT 0.7 02/22/2022   GFRNONAA 28 (L) 02/22/2022   GFRAA 27 (L) 02/14/2020    Medications: I have reviewed the patient's current medications.   Assessment/Plan: Macrocytic anemia   Renal insufficiency   3.   Serum monoclonal IgG kappa protein Bone survey 12/29/2020-subtle lytic lesions in the calvarium and proximal left femur Progressive elevation of free kappa light chains and increased kappa light chain proteinuria March 2022 Bone marrow biopsy 02/12/2021- 5- 7% plasma cells, kappa light chain restricted, 46 X,-Y karyotype   4.    Hypertension   5.    Idiopathic  nonimmune hemolytic anemia/thrombocytopenia in 2014, spontaneously resolved  6.   Mild hypercalcemia     Disposition: [name] appears stable.  We will follow-up on the myeloma panel from today.  He is stable from a hematologic standpoint.  The calcium remains mildly elevated.  We will check a PTH level today.  He will return for an office and lab visit in 4 months.  We will check a metastatic bone survey when he returns in 4 months.  Rodney Coder, MD  02/22/2022  2:08 PM

## 2022-02-22 NOTE — Telephone Encounter (Signed)
Called lab and requested to have PTH draw. [place] could not complete the request due to no gold top. PTH will draw on the lab visit on 06/25/22.

## 2022-02-23 LAB — IGG: IgG (Immunoglobin G), Serum: 1439 mg/dL (ref 603–1613)

## 2022-02-25 LAB — PROTEIN ELECTROPHORESIS, SERUM
A/G Ratio: 1.1 (ref 0.7–1.7)
Albumin ELP: 3.7 g/dL (ref 2.9–4.4)
Alpha-1-Globulin: 0.2 g/dL (ref 0.0–0.4)
Alpha-2-Globulin: 0.6 g/dL (ref 0.4–1.0)
Beta Globulin: 1.2 g/dL (ref 0.7–1.3)
Gamma Globulin: 1.4 g/dL (ref 0.4–1.8)
Globulin, Total: 3.3 g/dL (ref 2.2–3.9)
M-Spike, %: 0.9 g/dL — ABNORMAL HIGH
Total Protein ELP: 7 g/dL (ref 6.0–8.5)

## 2022-02-25 LAB — KAPPA/LAMBDA LIGHT CHAINS
Kappa free light chain: 990.6 mg/L — ABNORMAL HIGH (ref 3.3–19.4)
Kappa, lambda light chain ratio: 41.8 — ABNORMAL HIGH (ref 0.26–1.65)
Lambda free light chains: 23.7 mg/L (ref 5.7–26.3)

## 2022-02-26 ENCOUNTER — Telehealth: Payer: Self-pay | Admitting: *Deleted

## 2022-02-26 NOTE — Telephone Encounter (Signed)
Notified Rodney Elliott that bone survey has been scheduled at [facility] radiology on 9/19 at 10:00 with 0945 arrival. Afterwards come upstairs to see Dr. Benay Spice.

## 2022-06-25 ENCOUNTER — Inpatient Hospital Stay: Payer: Medicare Other | Attending: Oncology

## 2022-06-25 ENCOUNTER — Ambulatory Visit (HOSPITAL_BASED_OUTPATIENT_CLINIC_OR_DEPARTMENT_OTHER)
Admission: RE | Admit: 2022-06-25 | Discharge: 2022-06-25 | Disposition: A | Discharge status: Home / Self Care | Payer: Medicare Other | Source: Ambulatory Visit | Attending: Oncology | Admitting: Oncology

## 2022-06-25 ENCOUNTER — Inpatient Hospital Stay (HOSPITAL_BASED_OUTPATIENT_CLINIC_OR_DEPARTMENT_OTHER): Payer: Medicare Other | Admitting: Oncology

## 2022-06-25 ENCOUNTER — Other Ambulatory Visit (HOSPITAL_BASED_OUTPATIENT_CLINIC_OR_DEPARTMENT_OTHER): Payer: Self-pay

## 2022-06-25 VITALS — BP 108/58 | HR 73 | Temp 98.2°F | Resp 18 | Ht 68.0 in | Wt 143.0 lb

## 2022-06-25 DIAGNOSIS — D472 Monoclonal gammopathy: Secondary | ICD-10-CM | POA: Diagnosis present

## 2022-06-25 DIAGNOSIS — D539 Nutritional anemia, unspecified: Secondary | ICD-10-CM | POA: Diagnosis present

## 2022-06-25 DIAGNOSIS — I10 Essential (primary) hypertension: Secondary | ICD-10-CM | POA: Diagnosis not present

## 2022-06-25 DIAGNOSIS — Z79899 Other long term (current) drug therapy: Secondary | ICD-10-CM | POA: Insufficient documentation

## 2022-06-25 DIAGNOSIS — N289 Disorder of kidney and ureter, unspecified: Secondary | ICD-10-CM | POA: Insufficient documentation

## 2022-06-25 LAB — CBC WITH DIFFERENTIAL (CANCER CENTER ONLY)
Abs Immature Granulocytes: 0.01 10*3/uL (ref 0.00–0.07)
Basophils Absolute: 0 10*3/uL (ref 0.0–0.1)
Basophils Relative: 0 %
Eosinophils Absolute: 0.1 10*3/uL (ref 0.0–0.5)
Eosinophils Relative: 2 %
HCT: 30.2 % — ABNORMAL LOW (ref 39.0–52.0)
Hemoglobin: 10.6 g/dL — ABNORMAL LOW (ref 13.0–17.0)
Immature Granulocytes: 0 %
Lymphocytes Relative: 23 %
Lymphs Abs: 0.7 10*3/uL (ref 0.7–4.0)
MCH: 36.3 pg — ABNORMAL HIGH (ref 26.0–34.0)
MCHC: 35.1 g/dL (ref 30.0–36.0)
MCV: 103.4 fL — ABNORMAL HIGH (ref 80.0–100.0)
Monocytes Absolute: 0.2 10*3/uL (ref 0.1–1.0)
Monocytes Relative: 6 %
Neutro Abs: 2.1 10*3/uL (ref 1.7–7.7)
Neutrophils Relative %: 69 %
Platelet Count: 154 10*3/uL (ref 150–400)
RBC: 2.92 MIL/uL — ABNORMAL LOW (ref 4.22–5.81)
RDW: 11.4 % — ABNORMAL LOW (ref 11.5–15.5)
WBC Count: 3.1 10*3/uL — ABNORMAL LOW (ref 4.0–10.5)
nRBC: 0 % (ref 0.0–0.2)

## 2022-06-25 LAB — CMP (CANCER CENTER ONLY)
ALT: 14 U/L (ref 0–44)
AST: 21 U/L (ref 15–41)
Albumin: 4.2 g/dL (ref 3.5–5.0)
Alkaline Phosphatase: 52 U/L (ref 38–126)
Anion gap: 11 (ref 5–15)
BUN: 35 mg/dL — ABNORMAL HIGH (ref 8–23)
CO2: 23 mmol/L (ref 22–32)
Calcium: 10.5 mg/dL — ABNORMAL HIGH (ref 8.9–10.3)
Chloride: 106 mmol/L (ref 98–111)
Creatinine: 2.19 mg/dL — ABNORMAL HIGH (ref 0.61–1.24)
GFR, Estimated: 28 mL/min — ABNORMAL LOW (ref >60)
Glucose, Bld: 222 mg/dL — ABNORMAL HIGH (ref 70–99)
Potassium: 4.6 mmol/L (ref 3.5–5.1)
Sodium: 140 mmol/L (ref 135–145)
Total Bilirubin: 0.7 mg/dL (ref 0.3–1.2)
Total Protein: 7.4 g/dL (ref 6.5–8.1)

## 2022-06-25 MED ORDER — INFLUENZA VAC A&B SA ADJ QUAD 0.5 ML IM PRSY
PREFILLED_SYRINGE | INTRAMUSCULAR | 0 refills | Status: DC
  Filled 2022-06-25: qty 0.5, 1d supply, fill #0

## 2022-06-25 NOTE — Progress Notes (Signed)
  [place] OFFICE PROGRESS NOTE   Diagnosis: Anemia, serum monoclonal protein  INTERVAL HISTORY:   Rodney Elliott returns as scheduled.  He feels well.  No pain.  No recent infection.  No new complaint.  He reports he is taking a calcium supplement.  Objective:  Vital signs in last 24 hours:  Blood pressure (!) 108/58, pulse 73, temperature 98.2 F (36.8 C), temperature source Oral, resp. rate 18, height 5' 8" (1.727 m), weight 143 lb (64.9 kg), SpO2 100 %.    Lymphatics: No cervical, supraclavicular, axillary, or inguinal nodes Resp: Lungs with decreased breath sounds at the left lower posterior chest, no respiratory distress Cardio: Regular rate and rhythm GI: No hepatosplenomegaly Vascular: No leg edema   Lab Results:  Lab Results  Component Value Date   WBC 3.1 (L) 06/25/2022   HGB 10.6 (L) 06/25/2022   HCT 30.2 (L) 06/25/2022   MCV 103.4 (H) 06/25/2022   PLT 154 06/25/2022   NEUTROABS 2.1 06/25/2022    CMP  Lab Results  Component Value Date   NA 140 06/25/2022   K 4.6 06/25/2022   CL 106 06/25/2022   CO2 23 06/25/2022   GLUCOSE 222 (H) 06/25/2022   BUN 35 (H) 06/25/2022   CREATININE 2.19 (H) 06/25/2022   CALCIUM 10.5 (H) 06/25/2022   PROT 7.4 06/25/2022   ALBUMIN 4.2 06/25/2022   AST 21 06/25/2022   ALT 14 06/25/2022   ALKPHOS 52 06/25/2022   BILITOT 0.7 06/25/2022   GFRNONAA 28 (L) 06/25/2022   GFRAA 27 (L) 02/14/2020    Medications: I have reviewed the patient's current medications.   Assessment/Plan: Macrocytic anemia   Renal insufficiency   3.   Serum monoclonal IgG kappa protein Bone survey 12/29/2020-subtle lytic lesions in the calvarium and proximal left femur Progressive elevation of free kappa light chains and increased kappa light chain proteinuria March 2022 Bone marrow biopsy 02/12/2021- 5- 7% plasma cells, kappa light chain restricted, 46 X,-Y karyotype   4.    Hypertension   5.    Idiopathic nonimmune hemolytic  anemia/thrombocytopenia in 2014, spontaneously resolved  6.   Mild hypercalcemia   Disposition: Rodney. Elliott appears stable.  He has a serum monoclonal IgG kappa protein.  There is no clinical evidence for progression to multiple myeloma or another lymphoproliferative disorder.  We will follow-up on the metastatic bone survey and myeloma panel from today.  The myeloma panel was not significantly changed when he was here 4 months ago.  He has chronic mild hypercalcemia.  He reports he has been taking a calcium supplement.  I recommend he discontinue calcium.  We will follow-up on the PTH level from today.  Rodney. Elliott will remain up-to-date on influenza, pneumonia, and COVID-19 vaccines.  He will return for an office visit in 6 months.  Betsy Coder, MD  06/25/2022  11:30 AM

## 2022-06-26 LAB — KAPPA/LAMBDA LIGHT CHAINS
Kappa free light chain: 971 mg/L — ABNORMAL HIGH (ref 3.3–19.4)
Kappa, lambda light chain ratio: 41.5 — ABNORMAL HIGH (ref 0.26–1.65)
Lambda free light chains: 23.4 mg/L (ref 5.7–26.3)

## 2022-06-26 LAB — IGG: IgG (Immunoglobin G), Serum: 1430 mg/dL (ref 603–1613)

## 2022-06-27 LAB — PTH, INTACT AND CALCIUM
Calcium, Total (PTH): 9.9 mg/dL (ref 8.6–10.2)
PTH: 52 pg/mL (ref 15–65)

## 2022-06-28 LAB — PROTEIN ELECTROPHORESIS, SERUM
A/G Ratio: 1.2 (ref 0.7–1.7)
Albumin ELP: 3.8 g/dL (ref 2.9–4.4)
Alpha-1-Globulin: 0.2 g/dL (ref 0.0–0.4)
Alpha-2-Globulin: 0.5 g/dL (ref 0.4–1.0)
Beta Globulin: 1.1 g/dL (ref 0.7–1.3)
Gamma Globulin: 1.3 g/dL (ref 0.4–1.8)
Globulin, Total: 3.1 g/dL (ref 2.2–3.9)
M-Spike, %: 1.1 g/dL — ABNORMAL HIGH
Total Protein ELP: 6.9 g/dL (ref 6.0–8.5)

## 2022-07-01 ENCOUNTER — Telehealth: Payer: Self-pay | Admitting: *Deleted

## 2022-07-01 NOTE — Telephone Encounter (Signed)
Attempted to reach patient re: lab results. Phone not in service at this time. Not on MyChart.

## 2022-07-01 NOTE — Telephone Encounter (Signed)
-----   Message from Rodney Pier, MD sent at 06/29/2022  4:14 PM EDT ----- Please call patient, labs are stable, xrays look good, f/u as scheduled

## 2022-07-02 NOTE — Telephone Encounter (Signed)
2nd attempt to reach patient with lab results without success.

## 2022-08-23 ENCOUNTER — Other Ambulatory Visit: Payer: Self-pay

## 2022-08-23 DIAGNOSIS — R0602 Shortness of breath: Secondary | ICD-10-CM

## 2022-08-26 ENCOUNTER — Ambulatory Visit (INDEPENDENT_AMBULATORY_CARE_PROVIDER_SITE_OTHER): Payer: Medicare Other | Admitting: Internal Medicine

## 2022-08-26 DIAGNOSIS — R0602 Shortness of breath: Secondary | ICD-10-CM | POA: Diagnosis not present

## 2022-08-26 LAB — PULMONARY FUNCTION TEST
DL/VA: 3.15 ml/min/mmHg/L
DLCO cor: 17.17 ml/min/mmHg
DLCO unc: 17.17 ml/min/mmHg
FEF 25-75 Post: 0.48 L/sec
FEF 25-75 Pre: 0.39 L/sec
FEF2575-%Change-Post: 22 %
FEF2575-%Pred-Post: 43 %
FEF2575-%Pred-Pre: 35 %
FEV1-%Change-Post: 0 %
FEV1-%Pred-Post: 48 %
FEV1-%Pred-Pre: 48 %
FEV1-Post: 0.96 L
FEV1-Pre: 0.96 L
FEV1FVC-%Change-Post: -10 %
FEV1FVC-%Pred-Pre: 69 %
FEV6-%Change-Post: 10 %
FEV6-%Pred-Post: 77 %
FEV6-%Pred-Pre: 70 %
FEV6-Post: 2.11 L
FEV6-Pre: 1.9 L
FEV6FVC-%Change-Post: -1 %
FEV6FVC-%Pred-Post: 103 %
FEV6FVC-%Pred-Pre: 105 %
FVC-%Change-Post: 12 %
FVC-%Pred-Post: 74 %
FVC-%Pred-Pre: 66 %
FVC-Post: 2.24 L
FVC-Pre: 1.99 L
Post FEV1/FVC ratio: 43 %
Post FEV6/FVC ratio: 94 %
Pre FEV1/FVC ratio: 48 %
Pre FEV6/FVC Ratio: 96 %
RV % pred: 298 %
RV: 8.17 L
TLC % pred: 161 %
TLC: 10.48 L

## 2022-08-26 NOTE — Patient Instructions (Signed)
Full PFT Performed Today  

## 2022-08-26 NOTE — Progress Notes (Signed)
Full PFT Performed Today  

## 2022-12-26 ENCOUNTER — Inpatient Hospital Stay: Payer: Medicare Other | Attending: Oncology

## 2022-12-26 ENCOUNTER — Inpatient Hospital Stay (HOSPITAL_BASED_OUTPATIENT_CLINIC_OR_DEPARTMENT_OTHER): Payer: Medicare Other | Admitting: Oncology

## 2022-12-26 VITALS — BP 130/76 | HR 100 | Temp 98.2°F | Resp 18 | Ht 68.0 in | Wt 136.0 lb

## 2022-12-26 DIAGNOSIS — I10 Essential (primary) hypertension: Secondary | ICD-10-CM | POA: Insufficient documentation

## 2022-12-26 DIAGNOSIS — Z79899 Other long term (current) drug therapy: Secondary | ICD-10-CM | POA: Diagnosis not present

## 2022-12-26 DIAGNOSIS — D472 Monoclonal gammopathy: Secondary | ICD-10-CM | POA: Diagnosis not present

## 2022-12-26 DIAGNOSIS — N289 Disorder of kidney and ureter, unspecified: Secondary | ICD-10-CM | POA: Diagnosis not present

## 2022-12-26 DIAGNOSIS — D539 Nutritional anemia, unspecified: Secondary | ICD-10-CM | POA: Diagnosis not present

## 2022-12-26 LAB — CBC WITH DIFFERENTIAL (CANCER CENTER ONLY)
Abs Immature Granulocytes: 0 10*3/uL (ref 0.00–0.07)
Basophils Absolute: 0 10*3/uL (ref 0.0–0.1)
Basophils Relative: 0 %
Eosinophils Absolute: 0.1 10*3/uL (ref 0.0–0.5)
Eosinophils Relative: 3 %
HCT: 30.7 % — ABNORMAL LOW (ref 39.0–52.0)
Hemoglobin: 10.9 g/dL — ABNORMAL LOW (ref 13.0–17.0)
Immature Granulocytes: 0 %
Lymphocytes Relative: 29 %
Lymphs Abs: 0.8 10*3/uL (ref 0.7–4.0)
MCH: 37.3 pg — ABNORMAL HIGH (ref 26.0–34.0)
MCHC: 35.5 g/dL (ref 30.0–36.0)
MCV: 105.1 fL — ABNORMAL HIGH (ref 80.0–100.0)
Monocytes Absolute: 0.2 10*3/uL (ref 0.1–1.0)
Monocytes Relative: 7 %
Neutro Abs: 1.7 10*3/uL (ref 1.7–7.7)
Neutrophils Relative %: 61 %
Platelet Count: 137 10*3/uL — ABNORMAL LOW (ref 150–400)
RBC: 2.92 MIL/uL — ABNORMAL LOW (ref 4.22–5.81)
RDW: 12.2 % (ref 11.5–15.5)
WBC Count: 2.9 10*3/uL — ABNORMAL LOW (ref 4.0–10.5)
nRBC: 0 % (ref 0.0–0.2)

## 2022-12-26 LAB — CMP (CANCER CENTER ONLY)
ALT: 22 U/L (ref 0–44)
AST: 27 U/L (ref 15–41)
Albumin: 4.2 g/dL (ref 3.5–5.0)
Alkaline Phosphatase: 54 U/L (ref 38–126)
Anion gap: 6 (ref 5–15)
BUN: 32 mg/dL — ABNORMAL HIGH (ref 8–23)
CO2: 25 mmol/L (ref 22–32)
Calcium: 10.9 mg/dL — ABNORMAL HIGH (ref 8.9–10.3)
Chloride: 109 mmol/L (ref 98–111)
Creatinine: 2.25 mg/dL — ABNORMAL HIGH (ref 0.61–1.24)
GFR, Estimated: 27 mL/min — ABNORMAL LOW (ref >60)
Glucose, Bld: 157 mg/dL — ABNORMAL HIGH (ref 70–99)
Potassium: 4.5 mmol/L (ref 3.5–5.1)
Sodium: 140 mmol/L (ref 135–145)
Total Bilirubin: 0.8 mg/dL (ref 0.3–1.2)
Total Protein: 7.5 g/dL (ref 6.5–8.1)

## 2022-12-26 NOTE — Progress Notes (Signed)
  [place] OFFICE PROGRESS NOTE   Diagnosis: Anemia, monoclonal protein  INTERVAL HISTORY:   Mr or returns as scheduled.  He feels well.  No recent infection.  No complaint.  Objective:  Vital signs in last 24 hours:  Blood pressure 130/76, pulse 100, temperature 98.2 F (36.8 C), temperature source Oral, resp. rate 18, height 5\' 8"  (1.727 m), weight 136 lb (61.7 kg), SpO2 100 %.    Lymphatics: No cervical, supraclavicular, axillary, or inguinal nodes.  Prominent right axillary fat pad. Resp: Lungs clear bilaterally Cardio: Regular rate and rhythm GI: No hepatosplenomegaly, no mass, nontender Vascular: No leg edema Breast: Slight asymmetry with slight enlargement of the right compared to the left upper outer breast, no discrete mass   Lab Results:  Lab Results  Component Value Date   WBC 2.9 (L) 12/26/2022   HGB 10.9 (L) 12/26/2022   HCT 30.7 (L) 12/26/2022   MCV 105.1 (H) 12/26/2022   PLT 137 (L) 12/26/2022   NEUTROABS 1.7 12/26/2022    CMP  Lab Results  Component Value Date   NA 140 12/26/2022   K 4.5 12/26/2022   CL 109 12/26/2022   CO2 25 12/26/2022   GLUCOSE 157 (H) 12/26/2022   BUN 32 (H) 12/26/2022   CREATININE 2.25 (H) 12/26/2022   CALCIUM 10.9 (H) 12/26/2022   PROT 7.5 12/26/2022   ALBUMIN 4.2 12/26/2022   AST 27 12/26/2022   ALT 22 12/26/2022   ALKPHOS 54 12/26/2022   BILITOT 0.8 12/26/2022   GFRNONAA 27 (L) 12/26/2022   GFRAA 27 (L) 02/14/2020    Medications: I have reviewed the patient's current medications.   Assessment/Plan:  Macrocytic anemia   Renal insufficiency   3.   Serum monoclonal IgG kappa protein Bone survey 12/29/2020-subtle lytic lesions in the calvarium and proximal left femur Progressive elevation of free kappa light chains and increased kappa light chain proteinuria March 2022 Bone marrow biopsy 02/12/2021- 5- 7% plasma cells, kappa light chain restricted, 46 X,-Y karyotype   4.    Hypertension   5.     Idiopathic nonimmune hemolytic anemia/thrombocytopenia in 2014, spontaneously resolved  6.   Mild hypercalcemia    Disposition: Mr. Maris appears stable.  He has stable mild pancytopenia.  The calcium is chronically elevated.  I suspect he has hyperparathyroidism.  He likely has smoldering myeloma.  There is no clinical evidence for disease progression today.  Mr. Chao will return for an office visit in 4 months.  The slight asymmetry of the right breast and right axilla are likely benign findings.  Betsy Coder, MD  12/26/2022  11:36 AM

## 2022-12-27 LAB — KAPPA/LAMBDA LIGHT CHAINS
Kappa free light chain: 1170.4 mg/L — ABNORMAL HIGH (ref 3.3–19.4)
Kappa, lambda light chain ratio: 54.19 — ABNORMAL HIGH (ref 0.26–1.65)
Lambda free light chains: 21.6 mg/L (ref 5.7–26.3)

## 2022-12-30 LAB — PROTEIN ELECTROPHORESIS, SERUM
A/G Ratio: 1.2 (ref 0.7–1.7)
Albumin ELP: 3.8 g/dL (ref 2.9–4.4)
Alpha-1-Globulin: 0.2 g/dL (ref 0.0–0.4)
Alpha-2-Globulin: 0.5 g/dL (ref 0.4–1.0)
Beta Globulin: 0.9 g/dL (ref 0.7–1.3)
Gamma Globulin: 1.5 g/dL (ref 0.4–1.8)
Globulin, Total: 3.1 g/dL (ref 2.2–3.9)
M-Spike, %: 1 g/dL — ABNORMAL HIGH
Total Protein ELP: 6.9 g/dL (ref 6.0–8.5)

## 2023-01-03 ENCOUNTER — Telehealth: Payer: Self-pay | Admitting: *Deleted

## 2023-01-03 NOTE — Telephone Encounter (Signed)
-----   Message from Ladell Pier, MD sent at 01/02/2023  8:57 PM EDT ----- Please call patient with the abnormal serum protein is stable, follow-up as scheduled

## 2023-01-03 NOTE — Telephone Encounter (Signed)
Informed Rodney Elliott that his M-spike is stable. F/U as scheduled.

## 2023-04-08 ENCOUNTER — Telehealth: Payer: Self-pay | Admitting: Oncology

## 2023-04-23 ENCOUNTER — Inpatient Hospital Stay: Payer: Medicare Other | Attending: Oncology

## 2023-04-23 ENCOUNTER — Inpatient Hospital Stay: Payer: Medicare Other | Admitting: Oncology

## 2023-04-23 VITALS — BP 114/68 | HR 82 | Temp 98.1°F | Resp 19 | Ht 68.0 in | Wt 133.2 lb

## 2023-04-23 DIAGNOSIS — D472 Monoclonal gammopathy: Secondary | ICD-10-CM | POA: Diagnosis not present

## 2023-04-23 DIAGNOSIS — Z79899 Other long term (current) drug therapy: Secondary | ICD-10-CM | POA: Diagnosis not present

## 2023-04-23 DIAGNOSIS — I10 Essential (primary) hypertension: Secondary | ICD-10-CM | POA: Diagnosis not present

## 2023-04-23 DIAGNOSIS — D539 Nutritional anemia, unspecified: Secondary | ICD-10-CM | POA: Insufficient documentation

## 2023-04-23 DIAGNOSIS — N289 Disorder of kidney and ureter, unspecified: Secondary | ICD-10-CM | POA: Insufficient documentation

## 2023-04-23 LAB — CMP (CANCER CENTER ONLY)
ALT: 22 U/L (ref 0–44)
AST: 26 U/L (ref 15–41)
Albumin: 4.3 g/dL (ref 3.5–5.0)
Alkaline Phosphatase: 55 U/L (ref 38–126)
Anion gap: 8 (ref 5–15)
BUN: 40 mg/dL — ABNORMAL HIGH (ref 8–23)
CO2: 26 mmol/L (ref 22–32)
Calcium: 10.8 mg/dL — ABNORMAL HIGH (ref 8.9–10.3)
Chloride: 106 mmol/L (ref 98–111)
Creatinine: 2.37 mg/dL — ABNORMAL HIGH (ref 0.61–1.24)
GFR, Estimated: 25 mL/min — ABNORMAL LOW (ref >60)
Glucose, Bld: 146 mg/dL — ABNORMAL HIGH (ref 70–99)
Potassium: 4.3 mmol/L (ref 3.5–5.1)
Sodium: 140 mmol/L (ref 135–145)
Total Bilirubin: 0.9 mg/dL (ref 0.3–1.2)
Total Protein: 7.6 g/dL (ref 6.5–8.1)

## 2023-04-23 LAB — CBC WITH DIFFERENTIAL (CANCER CENTER ONLY)
Abs Immature Granulocytes: 0.01 10*3/uL (ref 0.00–0.07)
Basophils Absolute: 0 10*3/uL (ref 0.0–0.1)
Basophils Relative: 1 %
Eosinophils Absolute: 0.1 10*3/uL (ref 0.0–0.5)
Eosinophils Relative: 2 %
HCT: 31.1 % — ABNORMAL LOW (ref 39.0–52.0)
Hemoglobin: 11.1 g/dL — ABNORMAL LOW (ref 13.0–17.0)
Immature Granulocytes: 0 %
Lymphocytes Relative: 27 %
Lymphs Abs: 0.9 10*3/uL (ref 0.7–4.0)
MCH: 37.5 pg — ABNORMAL HIGH (ref 26.0–34.0)
MCHC: 35.7 g/dL (ref 30.0–36.0)
MCV: 105.1 fL — ABNORMAL HIGH (ref 80.0–100.0)
Monocytes Absolute: 0.2 10*3/uL (ref 0.1–1.0)
Monocytes Relative: 7 %
Neutro Abs: 2.2 10*3/uL (ref 1.7–7.7)
Neutrophils Relative %: 63 %
Platelet Count: 140 10*3/uL — ABNORMAL LOW (ref 150–400)
RBC: 2.96 MIL/uL — ABNORMAL LOW (ref 4.22–5.81)
RDW: 11.4 % — ABNORMAL LOW (ref 11.5–15.5)
WBC Count: 3.4 10*3/uL — ABNORMAL LOW (ref 4.0–10.5)
nRBC: 0 % (ref 0.0–0.2)

## 2023-04-23 NOTE — Progress Notes (Signed)
  [facility] OFFICE PROGRESS NOTE   Diagnosis: Serum monoclonal protein, renal insufficiency  INTERVAL HISTORY:   Rodney Elliott returns as scheduled.  He feels well.  No recent infection.  Good appetite.  He relates weight loss to discontinuing ice cream.  He plans to resume eating ice cream.  Objective:  Vital signs in last 24 hours:  Blood pressure 114/68, pulse 82, temperature 98.1 F (36.7 C), temperature source Oral, resp. rate 19, height 5\' 8"  (1.727 m), weight 133 lb 3.2 oz (60.4 kg), SpO2 100%.    Lymphatics: No cervical, supraclavicular, axillary, or inguinal nodes Resp: Decreased breath sounds at the left lower posterior chest, no respiratory distress Cardio: Regular rate and rhythm GI: No hepatosplenomegaly Vascular: No leg edema  Lab Results:  Lab Results  Component Value Date   WBC 3.4 (L) 04/23/2023   HGB 11.1 (L) 04/23/2023   HCT 31.1 (L) 04/23/2023   MCV 105.1 (H) 04/23/2023   PLT 140 (L) 04/23/2023   NEUTROABS 2.2 04/23/2023    CMP  Lab Results  Component Value Date   NA 140 04/23/2023   K 4.3 04/23/2023   CL 106 04/23/2023   CO2 26 04/23/2023   GLUCOSE 146 (H) 04/23/2023   BUN 40 (H) 04/23/2023   CREATININE 2.37 (H) 04/23/2023   CALCIUM 10.8 (H) 04/23/2023   PROT 7.6 04/23/2023   ALBUMIN 4.3 04/23/2023   AST 26 04/23/2023   ALT 22 04/23/2023   ALKPHOS 55 04/23/2023   BILITOT 0.9 04/23/2023   GFRNONAA 25 (L) 04/23/2023   GFRAA 27 (L) 02/14/2020    Medications: I have reviewed the patient's current medications.   Assessment/Plan: Macrocytic anemia   Renal insufficiency   3.   Serum monoclonal IgG kappa protein Bone survey 12/29/2020-subtle lytic lesions in the calvarium and proximal left femur Progressive elevation of free kappa light chains and increased kappa light chain proteinuria March 2022 Bone marrow biopsy 02/12/2021- 5- 7% plasma cells, kappa light chain restricted, 46 X,-Y karyotype   4.    Hypertension   5.     Idiopathic nonimmune hemolytic anemia/thrombocytopenia in 2014, spontaneously resolved  6.   Mild hypercalcemia     Disposition: Rodney Elliott has renal insufficiency and a serum monoclonal IgG kappa protein.  He is stable from a hematologic standpoint.  There is no clinical evidence for progression of multiple myeloma.  We will follow-up on the myeloma panel from today.  He has chronic mild elevation of the calcium level.  This is unlikely related to myeloma.  PTH level was normal last year.  The plan is to continue observation.  Rodney Elliott will return for an office and lab visit in December.  He is scheduled to see Rodney Elliott in the interim.   Rodney Papas, MD  04/23/2023  10:39 AM

## 2023-04-24 LAB — KAPPA/LAMBDA LIGHT CHAINS
Kappa free light chain: 1463.4 mg/L — ABNORMAL HIGH (ref 3.3–19.4)
Kappa, lambda light chain ratio: 65.33 — ABNORMAL HIGH (ref 0.26–1.65)
Lambda free light chains: 22.4 mg/L (ref 5.7–26.3)

## 2023-04-24 LAB — IGG: IgG (Immunoglobin G), Serum: 1658 mg/dL — ABNORMAL HIGH (ref 603–1613)

## 2023-04-25 ENCOUNTER — Inpatient Hospital Stay: Payer: Medicare Other

## 2023-04-25 ENCOUNTER — Inpatient Hospital Stay: Payer: Medicare Other | Admitting: Oncology

## 2023-04-27 LAB — PROTEIN ELECTROPHORESIS, SERUM
A/G Ratio: 1 (ref 0.7–1.7)
Albumin ELP: 3.6 g/dL (ref 2.9–4.4)
Alpha-1-Globulin: 0.2 g/dL (ref 0.0–0.4)
Alpha-2-Globulin: 0.6 g/dL (ref 0.4–1.0)
Beta Globulin: 1.1 g/dL (ref 0.7–1.3)
Gamma Globulin: 1.7 g/dL (ref 0.4–1.8)
Globulin, Total: 3.6 g/dL (ref 2.2–3.9)
M-Spike, %: 1.5 g/dL — ABNORMAL HIGH
Total Protein ELP: 7.2 g/dL (ref 6.0–8.5)

## 2023-04-28 ENCOUNTER — Other Ambulatory Visit: Payer: Self-pay

## 2023-04-28 ENCOUNTER — Telehealth: Payer: Self-pay

## 2023-04-28 DIAGNOSIS — D472 Monoclonal gammopathy: Secondary | ICD-10-CM

## 2023-04-28 NOTE — Telephone Encounter (Signed)
-----   Message from [name] sent at 04/28/2023  9:00 AM EDT ----- Please call patient, the myeloma markers are a little higher, repeat cbc,cmp,spep, IgG, light chains in 2 months, if higher again we will need to consider starting treatment for myeloma

## 2023-04-28 NOTE — Telephone Encounter (Signed)
Patient gave verbal understanding and had no further questions or concerns  

## 2023-06-30 ENCOUNTER — Inpatient Hospital Stay: Payer: Medicare Other | Attending: Oncology

## 2023-06-30 DIAGNOSIS — N289 Disorder of kidney and ureter, unspecified: Secondary | ICD-10-CM | POA: Insufficient documentation

## 2023-06-30 DIAGNOSIS — D472 Monoclonal gammopathy: Secondary | ICD-10-CM | POA: Diagnosis present

## 2023-06-30 LAB — CBC WITH DIFFERENTIAL (CANCER CENTER ONLY)
Abs Immature Granulocytes: 0.01 10*3/uL (ref 0.00–0.07)
Basophils Absolute: 0 10*3/uL (ref 0.0–0.1)
Basophils Relative: 0 %
Eosinophils Absolute: 0.1 10*3/uL (ref 0.0–0.5)
Eosinophils Relative: 3 %
HCT: 29.9 % — ABNORMAL LOW (ref 39.0–52.0)
Hemoglobin: 10.7 g/dL — ABNORMAL LOW (ref 13.0–17.0)
Immature Granulocytes: 0 %
Lymphocytes Relative: 29 %
Lymphs Abs: 0.9 10*3/uL (ref 0.7–4.0)
MCH: 37.7 pg — ABNORMAL HIGH (ref 26.0–34.0)
MCHC: 35.8 g/dL (ref 30.0–36.0)
MCV: 105.3 fL — ABNORMAL HIGH (ref 80.0–100.0)
Monocytes Absolute: 0.3 10*3/uL (ref 0.1–1.0)
Monocytes Relative: 8 %
Neutro Abs: 1.9 10*3/uL (ref 1.7–7.7)
Neutrophils Relative %: 60 %
Platelet Count: 144 10*3/uL — ABNORMAL LOW (ref 150–400)
RBC: 2.84 MIL/uL — ABNORMAL LOW (ref 4.22–5.81)
RDW: 11.9 % (ref 11.5–15.5)
WBC Count: 3.1 10*3/uL — ABNORMAL LOW (ref 4.0–10.5)
nRBC: 0 % (ref 0.0–0.2)

## 2023-06-30 LAB — CMP (CANCER CENTER ONLY)
ALT: 21 U/L (ref 0–44)
AST: 26 U/L (ref 15–41)
Albumin: 4 g/dL (ref 3.5–5.0)
Alkaline Phosphatase: 59 U/L (ref 38–126)
Anion gap: 8 (ref 5–15)
BUN: 34 mg/dL — ABNORMAL HIGH (ref 8–23)
CO2: 26 mmol/L (ref 22–32)
Calcium: 10.6 mg/dL — ABNORMAL HIGH (ref 8.9–10.3)
Chloride: 104 mmol/L (ref 98–111)
Creatinine: 2.2 mg/dL — ABNORMAL HIGH (ref 0.61–1.24)
GFR, Estimated: 27 mL/min — ABNORMAL LOW (ref >60)
Glucose, Bld: 197 mg/dL — ABNORMAL HIGH (ref 70–99)
Potassium: 4.4 mmol/L (ref 3.5–5.1)
Sodium: 138 mmol/L (ref 135–145)
Total Bilirubin: 0.9 mg/dL (ref 0.3–1.2)
Total Protein: 7.5 g/dL (ref 6.5–8.1)

## 2023-07-01 LAB — KAPPA/LAMBDA LIGHT CHAINS
Kappa free light chain: 1370 mg/L — ABNORMAL HIGH (ref 3.3–19.4)
Kappa, lambda light chain ratio: 56.85 — ABNORMAL HIGH (ref 0.26–1.65)
Lambda free light chains: 24.1 mg/L (ref 5.7–26.3)

## 2023-07-02 LAB — IGG: IgG (Immunoglobin G), Serum: 1704 mg/dL — ABNORMAL HIGH (ref 603–1613)

## 2023-07-03 LAB — PROTEIN ELECTROPHORESIS, SERUM
A/G Ratio: 1.2 (ref 0.7–1.7)
Albumin ELP: 3.8 g/dL (ref 2.9–4.4)
Alpha-1-Globulin: 0.2 g/dL (ref 0.0–0.4)
Alpha-2-Globulin: 0.5 g/dL (ref 0.4–1.0)
Beta Globulin: 1 g/dL (ref 0.7–1.3)
Gamma Globulin: 1.5 g/dL (ref 0.4–1.8)
Globulin, Total: 3.2 g/dL (ref 2.2–3.9)
M-Spike, %: 1.2 g/dL — ABNORMAL HIGH
Total Protein ELP: 7 g/dL (ref 6.0–8.5)

## 2023-07-04 ENCOUNTER — Telehealth: Payer: Self-pay | Admitting: *Deleted

## 2023-07-04 NOTE — Telephone Encounter (Signed)
-----   Message from [name] sent at 07/03/2023  6:01 PM EDT ----- Please call patient, abnormal protein is not significantly changed, follow-up as scheduled

## 2023-07-04 NOTE — Telephone Encounter (Signed)
Informed Rodney Elliott that MD said his IgG has not changed significantly. No need for treatment. F/U as scheduled.

## 2023-08-27 IMAGING — US US AORTA
1 series · 14 of 25 positions shown · non-contrast
Comparison: None.

CLINICAL DATA: Screening for abdominal aortic aneurysm.

EXAM:
ULTRASOUND OF ABDOMINAL AORTA
TECHNIQUE: Ultrasound examination of the abdominal aorta and proximal common
iliac arteries was performed to evaluate for aneurysm. Additional
color and Doppler images of the distal aorta were obtained to
document patency.

[Series 1: us aorta · 0.22mm/px · 14 of 25 slices shown]
[im 1/25]
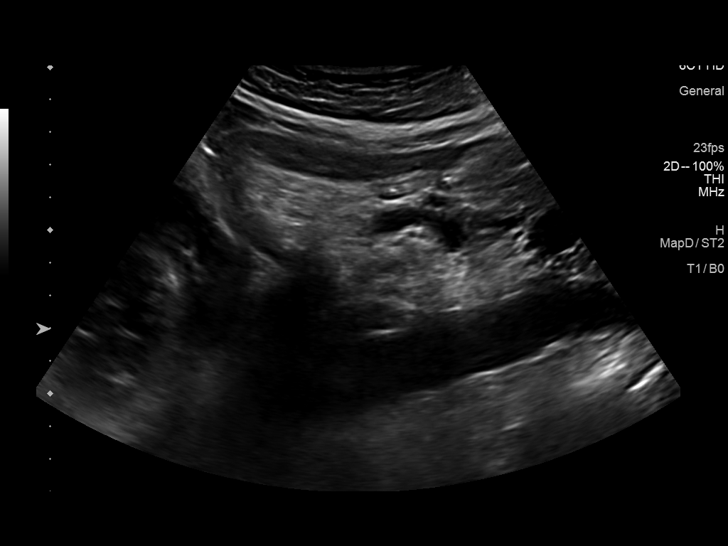
[im 3/25]
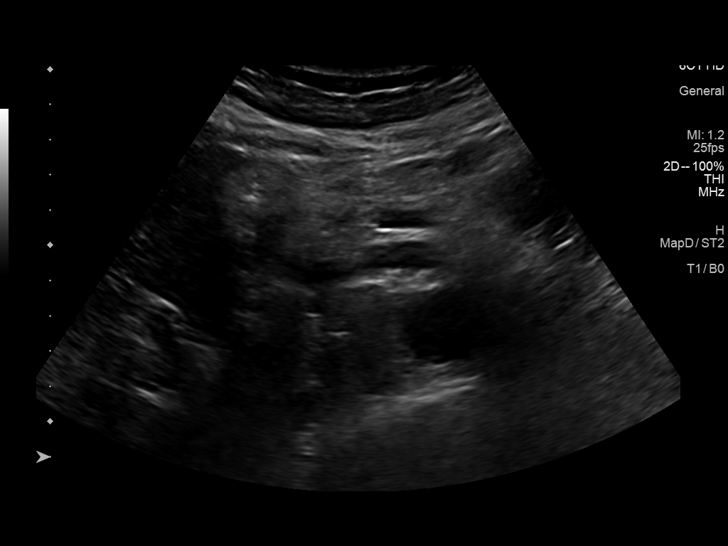
[im 5/25]
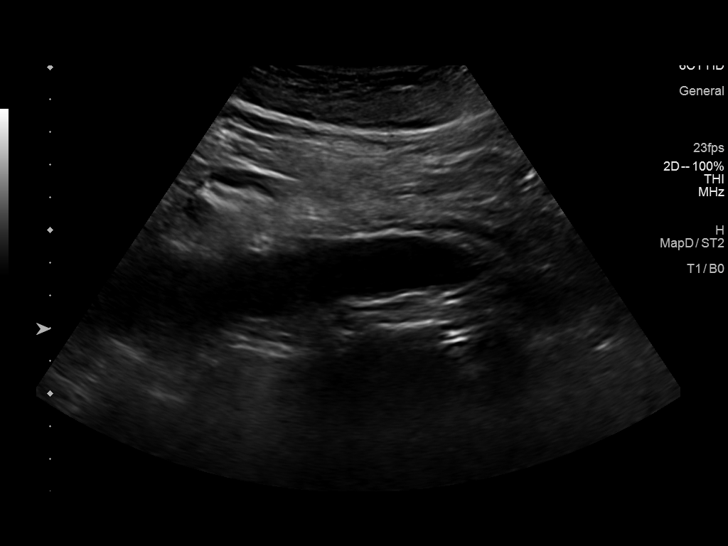
[im 7/25]
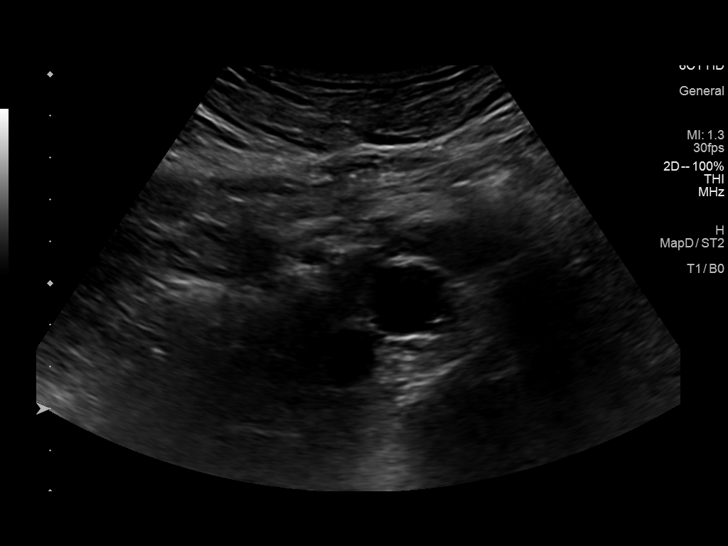
[im 9/25]
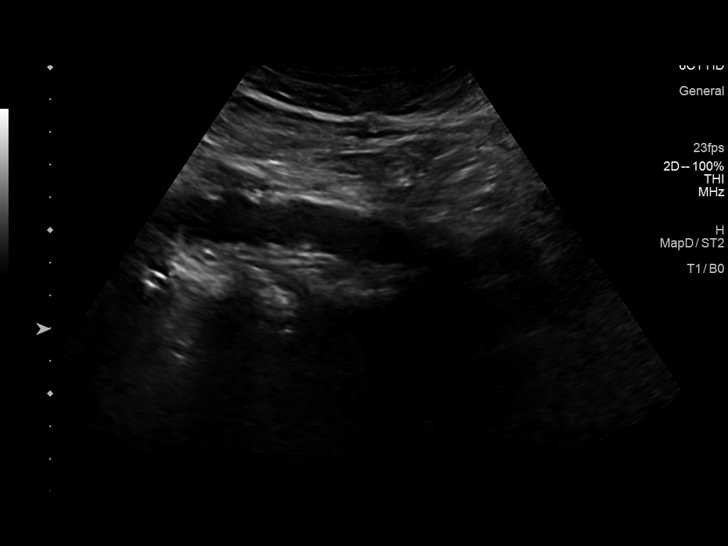
[im 10/25]
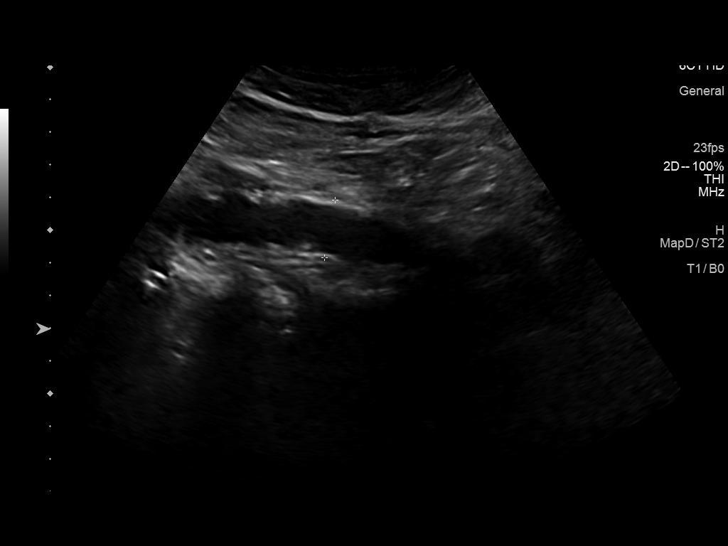
[im 12/25]
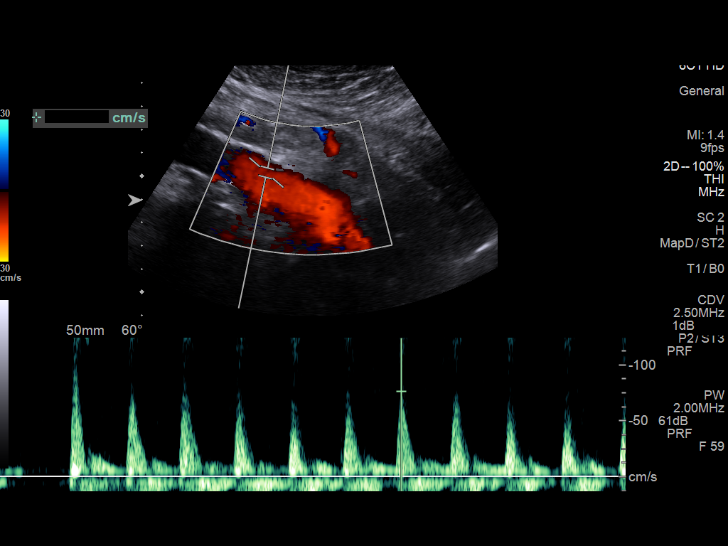
[im 14/25]
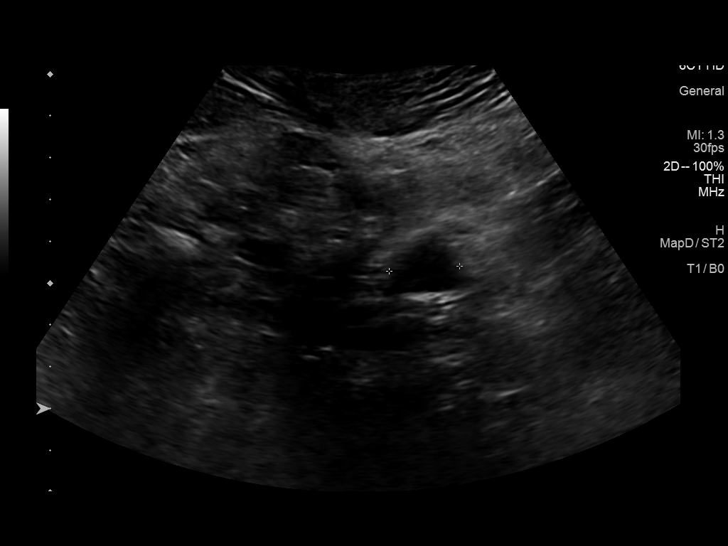
[im 16/25]
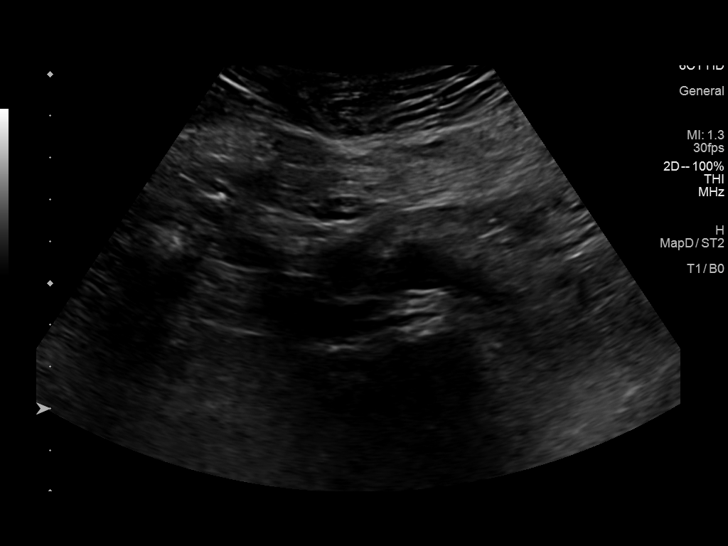
[im 17/25]
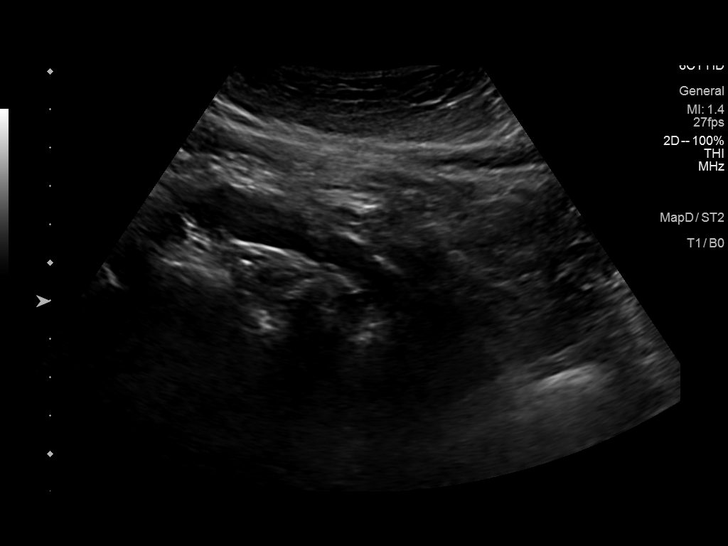
[im 19/25]
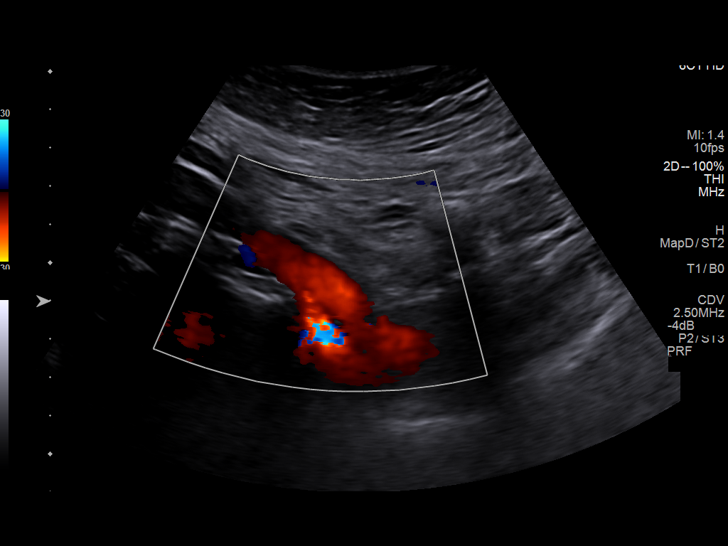
[im 21/25]
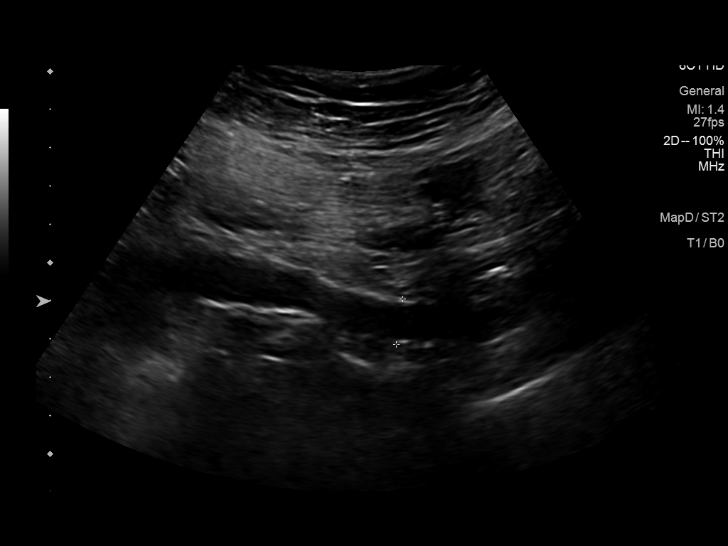
[im 23/25]
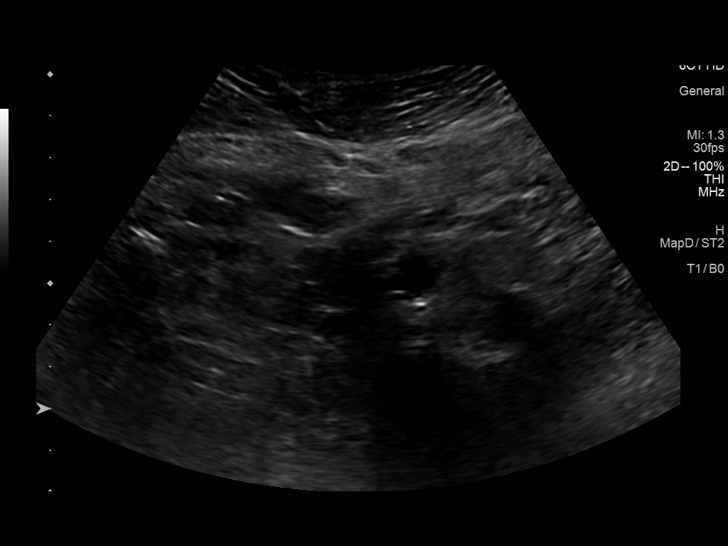
[im 25/25]
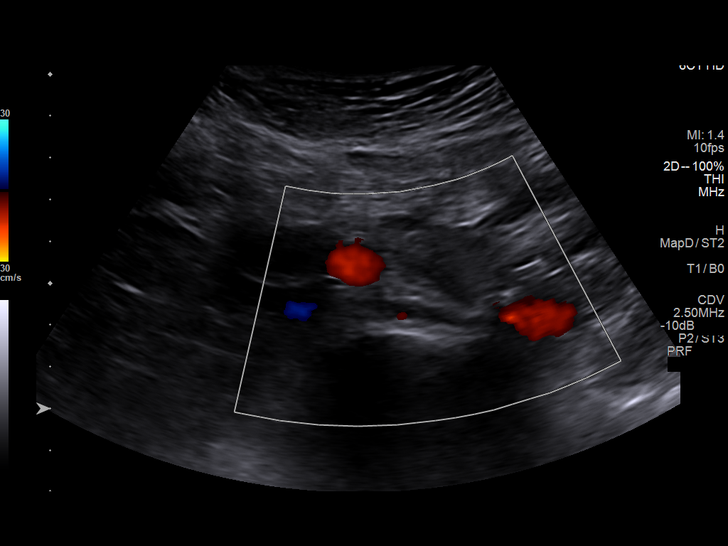

[14 of 25 positions shown; findings below may reference images not displayed]

FINDINGS: Abdominal aortic measurements as follows:

Proximal:  2.4 x 2.4 cm

Mid:  2.1 x 2.2 cm

Distal:  1.7 x 1.8 cm
Patent: Yes, peak systolic velocity is 76 cm/s

Right common iliac artery: 1.2 cm

Left common iliac artery: 1.3 cm

There is some calcified plaque in the abdominal aorta.
IMPRESSION: Atherosclerosis of the abdominal aorta without evidence of
aneurysmal disease.

## 2023-09-23 ENCOUNTER — Other Ambulatory Visit: Payer: Medicare Other

## 2023-09-23 ENCOUNTER — Inpatient Hospital Stay: Payer: Medicare Other

## 2023-09-23 ENCOUNTER — Inpatient Hospital Stay: Payer: Medicare Other | Attending: Oncology | Admitting: Oncology

## 2023-09-23 VITALS — BP 116/66 | HR 87 | Temp 98.2°F | Resp 18 | Ht 68.0 in | Wt 133.7 lb

## 2023-09-23 DIAGNOSIS — N189 Chronic kidney disease, unspecified: Secondary | ICD-10-CM | POA: Diagnosis not present

## 2023-09-23 DIAGNOSIS — Z79890 Hormone replacement therapy (postmenopausal): Secondary | ICD-10-CM | POA: Diagnosis not present

## 2023-09-23 DIAGNOSIS — I129 Hypertensive chronic kidney disease with stage 1 through stage 4 chronic kidney disease, or unspecified chronic kidney disease: Secondary | ICD-10-CM | POA: Insufficient documentation

## 2023-09-23 DIAGNOSIS — D472 Monoclonal gammopathy: Secondary | ICD-10-CM | POA: Insufficient documentation

## 2023-09-23 DIAGNOSIS — D539 Nutritional anemia, unspecified: Secondary | ICD-10-CM | POA: Diagnosis not present

## 2023-09-23 LAB — CBC WITH DIFFERENTIAL (CANCER CENTER ONLY)
Abs Immature Granulocytes: 0.01 10*3/uL (ref 0.00–0.07)
Basophils Absolute: 0 10*3/uL (ref 0.0–0.1)
Basophils Relative: 1 %
Eosinophils Absolute: 0.1 10*3/uL (ref 0.0–0.5)
Eosinophils Relative: 2 %
HCT: 31.1 % — ABNORMAL LOW (ref 39.0–52.0)
Hemoglobin: 10.8 g/dL — ABNORMAL LOW (ref 13.0–17.0)
Immature Granulocytes: 0 %
Lymphocytes Relative: 29 %
Lymphs Abs: 0.9 10*3/uL (ref 0.7–4.0)
MCH: 37.5 pg — ABNORMAL HIGH (ref 26.0–34.0)
MCHC: 34.7 g/dL (ref 30.0–36.0)
MCV: 108 fL — ABNORMAL HIGH (ref 80.0–100.0)
Monocytes Absolute: 0.2 10*3/uL (ref 0.1–1.0)
Monocytes Relative: 8 %
Neutro Abs: 1.9 10*3/uL (ref 1.7–7.7)
Neutrophils Relative %: 60 %
Platelet Count: 143 10*3/uL — ABNORMAL LOW (ref 150–400)
RBC: 2.88 MIL/uL — ABNORMAL LOW (ref 4.22–5.81)
RDW: 11.8 % (ref 11.5–15.5)
WBC Count: 3.1 10*3/uL — ABNORMAL LOW (ref 4.0–10.5)
nRBC: 0 % (ref 0.0–0.2)

## 2023-09-23 LAB — CMP (CANCER CENTER ONLY)
ALT: 24 U/L (ref 0–44)
AST: 26 U/L (ref 15–41)
Albumin: 3.9 g/dL (ref 3.5–5.0)
Alkaline Phosphatase: 50 U/L (ref 38–126)
Anion gap: 7 (ref 5–15)
BUN: 37 mg/dL — ABNORMAL HIGH (ref 8–23)
CO2: 26 mmol/L (ref 22–32)
Calcium: 10.8 mg/dL — ABNORMAL HIGH (ref 8.9–10.3)
Chloride: 105 mmol/L (ref 98–111)
Creatinine: 2.22 mg/dL — ABNORMAL HIGH (ref 0.61–1.24)
GFR, Estimated: 27 mL/min — ABNORMAL LOW (ref >60)
Glucose, Bld: 156 mg/dL — ABNORMAL HIGH (ref 70–99)
Potassium: 4.5 mmol/L (ref 3.5–5.1)
Sodium: 138 mmol/L (ref 135–145)
Total Bilirubin: 0.7 mg/dL (ref <1.2)
Total Protein: 7.4 g/dL (ref 6.5–8.1)

## 2023-09-23 NOTE — Progress Notes (Signed)
  [facility] OFFICE PROGRESS NOTE   Diagnosis: Monoclonal protein, chronic renal failure, chronic hypercalcemia  INTERVAL HISTORY:   [name] returns as scheduled.  He feels well.  Good appetite.  No pain.  No new complaint.  He was recently started on thyroid hormone replacement by Dr. Thornell Elliott  Objective:  Vital signs in last 24 hours:  Blood pressure 116/66, pulse 87, temperature 98.2 F (36.8 C), temperature source Temporal, resp. rate 18, height 5\' 8"  (1.727 m), weight 133 lb 11.2 oz (60.6 kg), SpO2 100%.     Lymphatics: No cervical, supraclavicular, axillary, or inguinal nodes Resp: Lungs clear bilaterally Cardio: Regular rate and rhythm GI: No hepatosplenomegaly, reducible left inguinal hernia Vascular: No leg edema   Lab Results:  Lab Results  Component Value Date   WBC 3.1 (L) 09/23/2023   HGB 10.8 (L) 09/23/2023   HCT 31.1 (L) 09/23/2023   MCV 108.0 (H) 09/23/2023   PLT 143 (L) 09/23/2023   NEUTROABS 1.9 09/23/2023    CMP  Lab Results  Component Value Date   NA 138 09/23/2023   K 4.5 09/23/2023   CL 105 09/23/2023   CO2 26 09/23/2023   GLUCOSE 156 (H) 09/23/2023   BUN 37 (H) 09/23/2023   CREATININE 2.22 (H) 09/23/2023   CALCIUM 10.8 (H) 09/23/2023   PROT 7.4 09/23/2023   ALBUMIN 3.9 09/23/2023   AST 26 09/23/2023   ALT 24 09/23/2023   ALKPHOS 50 09/23/2023   BILITOT 0.7 09/23/2023   GFRNONAA 27 (L) 09/23/2023   GFRAA 27 (L) 02/14/2020    No results found for: "CEA1", "CEA", "WRU045", "CA125"  No results found for: "INR", "LABPROT"  Imaging:  No results found.  Medications: I have reviewed the patient's current medications.   Assessment/Plan: Macrocytic anemia   Renal insufficiency   3.   Serum monoclonal IgG kappa protein Bone survey 12/29/2020-subtle lytic lesions in the calvarium and proximal left femur Progressive elevation of free kappa light chains and increased kappa light chain proteinuria March 2022 Bone  marrow biopsy 02/12/2021- 5- 7% plasma cells, kappa light chain restricted, 46 X,-Y karyotype   4.    Hypertension   5.    Idiopathic nonimmune hemolytic anemia/thrombocytopenia in 2014, spontaneously resolved  6.   Mild hypercalcemia     Disposition: [name] appears stable.  He has stable chronic renal insufficiency, mild hypercalcemia, anemia, and thrombocytopenia.  Will follow-up on the myeloma panel from today.  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  He will return for an office and lab visit in 6 months.  He will return for a lab visit in 3 months if the myeloma panel reveals an increase in the monoclonal protein.  Rodney Papas, MD  09/23/2023  12:11 PM

## 2023-09-24 LAB — KAPPA/LAMBDA LIGHT CHAINS
Kappa free light chain: 2034.3 mg/L — ABNORMAL HIGH (ref 3.3–19.4)
Kappa, lambda light chain ratio: 95.51 — ABNORMAL HIGH (ref 0.26–1.65)
Lambda free light chains: 21.3 mg/L (ref 5.7–26.3)

## 2023-09-25 LAB — IGG: IgG (Immunoglobin G), Serum: 1655 mg/dL — ABNORMAL HIGH (ref 603–1613)

## 2023-09-28 LAB — PROTEIN ELECTROPHORESIS, SERUM
A/G Ratio: 1.1 (ref 0.7–1.7)
Albumin ELP: 3.6 g/dL (ref 2.9–4.4)
Alpha-1-Globulin: 0.2 g/dL (ref 0.0–0.4)
Alpha-2-Globulin: 0.6 g/dL (ref 0.4–1.0)
Beta Globulin: 1.1 g/dL (ref 0.7–1.3)
Gamma Globulin: 1.6 g/dL (ref 0.4–1.8)
Globulin, Total: 3.4 g/dL (ref 2.2–3.9)
M-Spike, %: 1 g/dL — ABNORMAL HIGH
Total Protein ELP: 7 g/dL (ref 6.0–8.5)

## 2023-10-02 ENCOUNTER — Telehealth: Payer: Self-pay | Admitting: *Deleted

## 2023-10-02 DIAGNOSIS — D472 Monoclonal gammopathy: Secondary | ICD-10-CM

## 2023-10-02 NOTE — Telephone Encounter (Signed)
-----   Message from [name] sent at 09/30/2023  1:16 PM EST ----- Please call patient with a serum monoclonal protein and IgG levels are stable, light chains are slightly higher, repeat a CBC, CMP, myeloma panel and light chains in 3 months Follow-up office visit as scheduled

## 2023-10-02 NOTE — Telephone Encounter (Signed)
Notified of stable IgG and monoclonal protein. Light chains slightly higher. Requires no action at this time. MD wants to draw labs again in 3 months. He agrees to plan. Scheduling message sent.

## 2023-12-31 ENCOUNTER — Inpatient Hospital Stay: Payer: Medicare Other | Attending: Oncology

## 2023-12-31 DIAGNOSIS — N189 Chronic kidney disease, unspecified: Secondary | ICD-10-CM | POA: Diagnosis not present

## 2023-12-31 DIAGNOSIS — I10 Essential (primary) hypertension: Secondary | ICD-10-CM | POA: Diagnosis not present

## 2023-12-31 DIAGNOSIS — Z79899 Other long term (current) drug therapy: Secondary | ICD-10-CM | POA: Insufficient documentation

## 2023-12-31 DIAGNOSIS — D472 Monoclonal gammopathy: Secondary | ICD-10-CM | POA: Insufficient documentation

## 2023-12-31 LAB — CBC WITH DIFFERENTIAL (CANCER CENTER ONLY)
Abs Immature Granulocytes: 0.01 10*3/uL (ref 0.00–0.07)
Basophils Absolute: 0 10*3/uL (ref 0.0–0.1)
Basophils Relative: 0 %
Eosinophils Absolute: 0.1 10*3/uL (ref 0.0–0.5)
Eosinophils Relative: 3 %
HCT: 30.1 % — ABNORMAL LOW (ref 39.0–52.0)
Hemoglobin: 10.4 g/dL — ABNORMAL LOW (ref 13.0–17.0)
Immature Granulocytes: 0 %
Lymphocytes Relative: 32 %
Lymphs Abs: 1 10*3/uL (ref 0.7–4.0)
MCH: 37.3 pg — ABNORMAL HIGH (ref 26.0–34.0)
MCHC: 34.6 g/dL (ref 30.0–36.0)
MCV: 107.9 fL — ABNORMAL HIGH (ref 80.0–100.0)
Monocytes Absolute: 0.2 10*3/uL (ref 0.1–1.0)
Monocytes Relative: 7 %
Neutro Abs: 1.8 10*3/uL (ref 1.7–7.7)
Neutrophils Relative %: 58 %
Platelet Count: 159 10*3/uL (ref 150–400)
RBC: 2.79 MIL/uL — ABNORMAL LOW (ref 4.22–5.81)
RDW: 12.6 % (ref 11.5–15.5)
WBC Count: 3.1 10*3/uL — ABNORMAL LOW (ref 4.0–10.5)
nRBC: 0 % (ref 0.0–0.2)

## 2023-12-31 LAB — CMP (CANCER CENTER ONLY)
ALT: 16 U/L (ref 0–44)
AST: 23 U/L (ref 15–41)
Albumin: 3.9 g/dL (ref 3.5–5.0)
Alkaline Phosphatase: 65 U/L (ref 38–126)
Anion gap: 8 (ref 5–15)
BUN: 25 mg/dL — ABNORMAL HIGH (ref 8–23)
CO2: 26 mmol/L (ref 22–32)
Calcium: 10.9 mg/dL — ABNORMAL HIGH (ref 8.9–10.3)
Chloride: 106 mmol/L (ref 98–111)
Creatinine: 2.21 mg/dL — ABNORMAL HIGH (ref 0.61–1.24)
GFR, Estimated: 27 mL/min — ABNORMAL LOW (ref >60)
Glucose, Bld: 146 mg/dL — ABNORMAL HIGH (ref 70–99)
Potassium: 4.5 mmol/L (ref 3.5–5.1)
Sodium: 140 mmol/L (ref 135–145)
Total Bilirubin: 0.8 mg/dL (ref 0.0–1.2)
Total Protein: 7.4 g/dL (ref 6.5–8.1)

## 2024-01-01 LAB — KAPPA/LAMBDA LIGHT CHAINS
Kappa free light chain: 2188 mg/L — ABNORMAL HIGH (ref 3.3–19.4)
Kappa, lambda light chain ratio: 90.79 — ABNORMAL HIGH (ref 0.26–1.65)
Lambda free light chains: 24.1 mg/L (ref 5.7–26.3)

## 2024-01-05 ENCOUNTER — Telehealth: Payer: Self-pay

## 2024-01-05 LAB — MULTIPLE MYELOMA PANEL, SERUM
Albumin SerPl Elph-Mcnc: 3.6 g/dL (ref 2.9–4.4)
Albumin/Glob SerPl: 1.1 (ref 0.7–1.7)
Alpha 1: 0.2 g/dL (ref 0.0–0.4)
Alpha2 Glob SerPl Elph-Mcnc: 0.6 g/dL (ref 0.4–1.0)
B-Globulin SerPl Elph-Mcnc: 1 g/dL (ref 0.7–1.3)
Gamma Glob SerPl Elph-Mcnc: 1.6 g/dL (ref 0.4–1.8)
Globulin, Total: 3.4 g/dL (ref 2.2–3.9)
IgA: 325 mg/dL (ref 61–437)
IgG (Immunoglobin G), Serum: 1822 mg/dL — ABNORMAL HIGH (ref 603–1613)
IgM (Immunoglobulin M), Srm: 39 mg/dL (ref 15–143)
M Protein SerPl Elph-Mcnc: 1.2 g/dL — ABNORMAL HIGH
Total Protein ELP: 7 g/dL (ref 6.0–8.5)

## 2024-01-05 NOTE — Telephone Encounter (Signed)
 Telephoned patient per Dr. Truett Perna and shared the following message: "The myeloma protein and kidney function are stable, follow-up as scheduled." Patient agreed.

## 2024-03-23 ENCOUNTER — Inpatient Hospital Stay: Payer: Medicare Other | Attending: Oncology

## 2024-03-23 ENCOUNTER — Inpatient Hospital Stay (HOSPITAL_BASED_OUTPATIENT_CLINIC_OR_DEPARTMENT_OTHER): Payer: Medicare Other | Admitting: Oncology

## 2024-03-23 VITALS — BP 115/64 | HR 80 | Temp 98.1°F | Resp 18 | Ht 68.0 in | Wt 129.2 lb

## 2024-03-23 DIAGNOSIS — N289 Disorder of kidney and ureter, unspecified: Secondary | ICD-10-CM | POA: Insufficient documentation

## 2024-03-23 DIAGNOSIS — D472 Monoclonal gammopathy: Secondary | ICD-10-CM | POA: Insufficient documentation

## 2024-03-23 DIAGNOSIS — I10 Essential (primary) hypertension: Secondary | ICD-10-CM | POA: Insufficient documentation

## 2024-03-23 DIAGNOSIS — Z79899 Other long term (current) drug therapy: Secondary | ICD-10-CM | POA: Insufficient documentation

## 2024-03-23 DIAGNOSIS — D539 Nutritional anemia, unspecified: Secondary | ICD-10-CM | POA: Diagnosis present

## 2024-03-23 LAB — CBC WITH DIFFERENTIAL (CANCER CENTER ONLY)
Abs Immature Granulocytes: 0.01 10*3/uL (ref 0.00–0.07)
Basophils Absolute: 0 10*3/uL (ref 0.0–0.1)
Basophils Relative: 1 %
Eosinophils Absolute: 0.1 10*3/uL (ref 0.0–0.5)
Eosinophils Relative: 4 %
HCT: 27.8 % — ABNORMAL LOW (ref 39.0–52.0)
Hemoglobin: 9.9 g/dL — ABNORMAL LOW (ref 13.0–17.0)
Immature Granulocytes: 0 %
Lymphocytes Relative: 34 %
Lymphs Abs: 0.8 10*3/uL (ref 0.7–4.0)
MCH: 37.2 pg — ABNORMAL HIGH (ref 26.0–34.0)
MCHC: 35.6 g/dL (ref 30.0–36.0)
MCV: 104.5 fL — ABNORMAL HIGH (ref 80.0–100.0)
Monocytes Absolute: 0.2 10*3/uL (ref 0.1–1.0)
Monocytes Relative: 8 %
Neutro Abs: 1.3 10*3/uL — ABNORMAL LOW (ref 1.7–7.7)
Neutrophils Relative %: 53 %
Platelet Count: 113 10*3/uL — ABNORMAL LOW (ref 150–400)
RBC: 2.66 MIL/uL — ABNORMAL LOW (ref 4.22–5.81)
RDW: 12.5 % (ref 11.5–15.5)
WBC Count: 2.4 10*3/uL — ABNORMAL LOW (ref 4.0–10.5)
nRBC: 0 % (ref 0.0–0.2)

## 2024-03-23 LAB — CMP (CANCER CENTER ONLY)
ALT: 24 U/L (ref 0–44)
AST: 35 U/L (ref 15–41)
Albumin: 3.9 g/dL (ref 3.5–5.0)
Alkaline Phosphatase: 75 U/L (ref 38–126)
Anion gap: 9 (ref 5–15)
BUN: 25 mg/dL — ABNORMAL HIGH (ref 8–23)
CO2: 25 mmol/L (ref 22–32)
Calcium: 10.6 mg/dL — ABNORMAL HIGH (ref 8.9–10.3)
Chloride: 103 mmol/L (ref 98–111)
Creatinine: 2.22 mg/dL — ABNORMAL HIGH (ref 0.61–1.24)
GFR, Estimated: 27 mL/min — ABNORMAL LOW (ref >60)
Glucose, Bld: 183 mg/dL — ABNORMAL HIGH (ref 70–99)
Potassium: 4.7 mmol/L (ref 3.5–5.1)
Sodium: 137 mmol/L (ref 135–145)
Total Bilirubin: 0.5 mg/dL (ref 0.0–1.2)
Total Protein: 7.5 g/dL (ref 6.5–8.1)

## 2024-03-23 NOTE — Progress Notes (Signed)
  [facility] OFFICE PROGRESS NOTE   Diagnosis: Serum monoclonal protein, renal insufficiency  INTERVAL HISTORY:   Rodney Elliott returns as scheduled.  He feels well.  Good appetite.  No pain.  No recent infection.  No new complaint.  Objective:  Vital signs in last 24 hours:  Blood pressure 115/64, pulse 80, temperature 98.1 F (36.7 C), temperature source Temporal, resp. rate 18, height 5' 8 (1.727 m), weight 129 lb 3.2 oz (58.6 kg), SpO2 100%.   Lymphatics: No cervical, supraclavicular, axillary, or inguinal nodes Resp: Lungs clear bilaterally Cardio: Regular rate and rhythm GI: No mass, no hepatosplenomegaly, reducible left inguinal hernia   Lab Results:  Lab Results  Component Value Date   WBC 2.4 (L) 03/23/2024   HGB 9.9 (L) 03/23/2024   HCT 27.8 (L) 03/23/2024   MCV 104.5 (H) 03/23/2024   PLT 113 (L) 03/23/2024   NEUTROABS 1.3 (L) 03/23/2024    CMP  Lab Results  Component Value Date   NA 137 03/23/2024   K 4.7 03/23/2024   CL 103 03/23/2024   CO2 25 03/23/2024   GLUCOSE 183 (H) 03/23/2024   BUN 25 (H) 03/23/2024   CREATININE 2.22 (H) 03/23/2024   CALCIUM 10.6 (H) 03/23/2024   PROT 7.5 03/23/2024   ALBUMIN 3.9 03/23/2024   AST 35 03/23/2024   ALT 24 03/23/2024   ALKPHOS 75 03/23/2024   BILITOT 0.5 03/23/2024   GFRNONAA 27 (L) 03/23/2024   GFRAA 27 (L) 02/14/2020    Medications: I have reviewed the patient's current medications.   Assessment/Plan: Macrocytic anemia   Renal insufficiency   3.   Serum monoclonal IgG kappa protein Bone survey 12/29/2020-subtle lytic lesions in the calvarium and proximal left femur Progressive elevation of free kappa light chains and increased kappa light chain proteinuria March 2022 Bone marrow biopsy 02/12/2021- 5- 7% plasma cells, kappa light chain restricted, 46 X,-Y karyotype   4.    Hypertension   5.    Idiopathic nonimmune hemolytic anemia/thrombocytopenia in 2014, spontaneously resolved  6.    Mild hypercalcemia      Disposition: Rodney Elliott appears stable.  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  The kappa light chains are higher over the past year.  He is more anemic today.  I will refer him for a repeat bone marrow biopsy to look for evidence of progression to overt myeloma.  He will return for an office visit after the bone marrow biopsy.  We will consider initiating systemic therapy for multiple myeloma based on the bone marrow findings and myeloma panel from today.  Coni Deep, MD  03/23/2024  12:04 PM

## 2024-03-24 LAB — KAPPA/LAMBDA LIGHT CHAINS
Kappa free light chain: 2072.3 mg/L — ABNORMAL HIGH (ref 3.3–19.4)
Kappa, lambda light chain ratio: 100.6 — ABNORMAL HIGH (ref 0.26–1.65)
Lambda free light chains: 20.6 mg/L (ref 5.7–26.3)

## 2024-03-24 LAB — IGG: IgG (Immunoglobin G), Serum: 1940 mg/dL — ABNORMAL HIGH (ref 603–1613)

## 2024-03-25 LAB — PROTEIN ELECTROPHORESIS, SERUM
A/G Ratio: 1.1 (ref 0.7–1.7)
Albumin ELP: 3.6 g/dL (ref 2.9–4.4)
Alpha-1-Globulin: 0.2 g/dL (ref 0.0–0.4)
Alpha-2-Globulin: 0.5 g/dL (ref 0.4–1.0)
Beta Globulin: 1 g/dL (ref 0.7–1.3)
Gamma Globulin: 1.7 g/dL (ref 0.4–1.8)
Globulin, Total: 3.3 g/dL (ref 2.2–3.9)
M-Spike, %: 1.3 g/dL — ABNORMAL HIGH
Total Protein ELP: 6.9 g/dL (ref 6.0–8.5)

## 2024-04-07 ENCOUNTER — Ambulatory Visit: Admitting: Oncology

## 2024-04-14 ENCOUNTER — Other Ambulatory Visit: Payer: Self-pay | Admitting: Radiology

## 2024-04-14 DIAGNOSIS — D472 Monoclonal gammopathy: Secondary | ICD-10-CM

## 2024-04-14 NOTE — H&P (Incomplete)
 Chief Complaint: Macrocytic anemia, renal insufficiency, serum monoclonal IgG kappa protein; referred for CT-guided bone marrow biopsy to rule out progression to overt myeloma  Referring Provider(s): Sherrill,B  Supervising Physician: Philip Cornet  Patient Status: [facility] - Out-pt  History of Present Illness: Rodney Elliott is a 89 y.o. male with past medical history significant for macrocytic anemia, renal insufficiency, serum monoclonal IgG kappa protein with known lytic lesions in the calvarium and proximal left femur, hypertension, mild hypercalcemia.  Patient's kappa light chains are higher over the past year and he is more anemic now.  He presents today for CT-guided bone marrow biopsy to look for evidence of progression to overt myeloma.  *** Patient is Full Code  Past Medical History:  Diagnosis Date   Tubular adenoma of colon    2010    No past surgical history on file.  Allergies: Patient has no known allergies.  Medications: Prior to Admission medications   Medication Sig Start Date End Date Taking? Authorizing Provider  ADVAIR DISKUS 100-50 MCG/DOSE AEPB Inhale 1 puff into the lungs daily. 03/11/16   [provider]  albuterol (VENTOLIN HFA) 108 (90 Base) MCG/ACT inhaler Inhale 1 puff into the lungs every 4 (four) hours as needed. 07/14/23   [provider]  amLODipine (NORVASC) 5 MG tablet Take 5 mg by mouth daily. 02/08/22   [provider]  Cholecalciferol (D3-1000) 25 MCG (1000 UT) capsule Take 1,000 Units by mouth daily.    [provider]  doxazosin (CARDURA) 8 MG tablet Take 8 mg by mouth daily. 02/05/16   [provider]  fexofenadine (ALLEGRA) 180 MG tablet Take 180 mg by mouth daily. Alternates w/claritin    [provider]  folic acid (FOLVITE) 1 MG tablet Take 1 mg by mouth daily. 03/27/16   [provider]  irbesartan (AVAPRO) 150 MG tablet Take 150 mg by mouth daily. 12/09/19   [provider]   levothyroxine (SYNTHROID) 50 MCG tablet Take 50 mcg by mouth daily before breakfast.    [provider]  loratadine (CLARITIN) 10 MG tablet Take 10 mg by mouth daily. Patient not taking: Reported on 03/23/2024    [provider]  metoprolol succinate (TOPROL-XL) 50 MG 24 hr tablet Take 50 mg by mouth daily. 03/18/16   [provider]  montelukast (SINGULAIR) 10 MG tablet Take 10 mg by mouth daily. 02/08/22   [provider]  polyethylene glycol (MIRALAX / GLYCOLAX) 17 g packet Take 17 g by mouth daily.    [provider]     No family history on file.  Social History   Socioeconomic History   Marital status: Widowed    Spouse name: Not on file   Number of children: Not on file   Years of education: Not on file   Highest education level: Not on file  Occupational History   Not on file  Tobacco Use   Smoking status: Never   Smokeless tobacco: Never  Substance and Sexual Activity   Alcohol use: Not on file   Drug use: Never   Sexual activity: Not Currently  Other Topics Concern   Not on file  Social History Narrative   Not on file   Social Drivers of Health   Financial Resource Strain: Not on file  Food Insecurity: Not on file  Transportation Needs: Not on file  Physical Activity: Not on file  Stress: Not on file  Social Connections: Not on file  Review of Systems  Vital Signs:   Advance Care Plan: No documents on file.  Physical Exam  Imaging: No results found.  Labs:  CBC: Recent Labs    06/30/23 1031 09/23/23 1050 12/31/23 1042 03/23/24 1120  WBC 3.1* 3.1* 3.1* 2.4*  HGB 10.7* 10.8* 10.4* 9.9*  HCT 29.9* 31.1* 30.1* 27.8*  PLT 144* 143* 159 113*    COAGS: No results for input(s): INR, APTT in the last 8760 hours.  BMP: Recent Labs    06/30/23 1031 09/23/23 1050 12/31/23 1042 03/23/24 1120  NA 138 138 140 137  K 4.4 4.5 4.5 4.7  CL 104 105 106 103  CO2 26 26 26 25   GLUCOSE 197*  156* 146* 183*  BUN 34* 37* 25* 25*  CALCIUM 10.6* 10.8* 10.9* 10.6*  CREATININE 2.20* 2.22* 2.21* 2.22*  GFRNONAA 27* 27* 27* 27*    LIVER FUNCTION TESTS: Recent Labs    06/30/23 1031 09/23/23 1050 12/31/23 1042 03/23/24 1120  BILITOT 0.9 0.7 0.8 0.5  AST 26 26 23  35  ALT 21 24 16 24   ALKPHOS 59 50 65 75  PROT 7.5 7.4 7.4 7.5  ALBUMIN 4.0 3.9 3.9 3.9    TUMOR MARKERS: No results for input(s): AFPTM, CEA, CA199, CHROMGRNA in the last 8760 hours.  Assessment and Plan: 89 y.o. male with past medical history significant for macrocytic anemia, renal insufficiency, serum monoclonal IgG kappa protein with known lytic lesions in the calvarium and proximal left femur, hypertension, mild hypercalcemia.  Patient's kappa light chains are higher over the past year and he is more anemic now.  He presents today for CT-guided bone marrow biopsy to look for evidence of progression to overt myeloma.Risks and benefits of procedure was discussed with the patient  including, but not limited to bleeding, infection, damage to adjacent structures or low yield requiring additional tests.  All of the questions were answered and there is agreement to proceed.  Consent signed and in chart.  Patient known to IR team from prior bone marrow biopsy in 2022.  Thank you for allowing our service to participate in DARRELLE WIBERG 's care.  Electronically Signed: D. Franky Rakers, PA-C   04/14/2024, 4:19 PM      I spent a total of    15 Minutes in face to face in clinical consultation, greater than 50% of which was counseling/coordinating care for image guided bone marrow biopsy

## 2024-04-15 ENCOUNTER — Ambulatory Visit (HOSPITAL_COMMUNITY)
Admission: RE | Admit: 2024-04-15 | Discharge: 2024-04-15 | Disposition: A | Discharge status: Home / Self Care | Source: Ambulatory Visit | Attending: Oncology | Admitting: Oncology

## 2024-04-15 ENCOUNTER — Other Ambulatory Visit: Payer: Self-pay

## 2024-04-15 ENCOUNTER — Encounter (HOSPITAL_COMMUNITY): Payer: Self-pay

## 2024-04-15 DIAGNOSIS — D539 Nutritional anemia, unspecified: Secondary | ICD-10-CM | POA: Diagnosis not present

## 2024-04-15 DIAGNOSIS — N289 Disorder of kidney and ureter, unspecified: Secondary | ICD-10-CM | POA: Insufficient documentation

## 2024-04-15 DIAGNOSIS — D61818 Other pancytopenia: Secondary | ICD-10-CM | POA: Diagnosis not present

## 2024-04-15 DIAGNOSIS — J45909 Unspecified asthma, uncomplicated: Secondary | ICD-10-CM | POA: Insufficient documentation

## 2024-04-15 DIAGNOSIS — I10 Essential (primary) hypertension: Secondary | ICD-10-CM | POA: Diagnosis not present

## 2024-04-15 DIAGNOSIS — D472 Monoclonal gammopathy: Secondary | ICD-10-CM | POA: Diagnosis not present

## 2024-04-15 DIAGNOSIS — C9000 Multiple myeloma not having achieved remission: Secondary | ICD-10-CM | POA: Insufficient documentation

## 2024-04-15 DIAGNOSIS — Z01812 Encounter for preprocedural laboratory examination: Secondary | ICD-10-CM | POA: Diagnosis present

## 2024-04-15 HISTORY — DX: Unspecified asthma, uncomplicated: J45.909

## 2024-04-15 HISTORY — DX: Malignant (primary) neoplasm, unspecified: C80.1

## 2024-04-15 HISTORY — DX: Essential (primary) hypertension: I10

## 2024-04-15 LAB — CBC WITH DIFFERENTIAL/PLATELET
Abs Immature Granulocytes: 0.01 K/uL (ref 0.00–0.07)
Basophils Absolute: 0 K/uL (ref 0.0–0.1)
Basophils Relative: 0 %
Eosinophils Absolute: 0.1 K/uL (ref 0.0–0.5)
Eosinophils Relative: 2 %
HCT: 29.4 % — ABNORMAL LOW (ref 39.0–52.0)
Hemoglobin: 10.3 g/dL — ABNORMAL LOW (ref 13.0–17.0)
Immature Granulocytes: 0 %
Lymphocytes Relative: 18 %
Lymphs Abs: 0.7 K/uL (ref 0.7–4.0)
MCH: 37.9 pg — ABNORMAL HIGH (ref 26.0–34.0)
MCHC: 35 g/dL (ref 30.0–36.0)
MCV: 108.1 fL — ABNORMAL HIGH (ref 80.0–100.0)
Monocytes Absolute: 0.3 K/uL (ref 0.1–1.0)
Monocytes Relative: 7 %
Neutro Abs: 2.7 K/uL (ref 1.7–7.7)
Neutrophils Relative %: 73 %
Platelets: 125 K/uL — ABNORMAL LOW (ref 150–400)
RBC: 2.72 MIL/uL — ABNORMAL LOW (ref 4.22–5.81)
RDW: 12.6 % (ref 11.5–15.5)
WBC: 3.7 K/uL — ABNORMAL LOW (ref 4.0–10.5)
nRBC: 0 % (ref 0.0–0.2)

## 2024-04-15 MED ORDER — FENTANYL CITRATE (PF) 100 MCG/2ML IJ SOLN
INTRAMUSCULAR | Status: AC
  Filled 2024-04-15: qty 2

## 2024-04-15 MED ORDER — MIDAZOLAM HCL 2 MG/2ML IJ SOLN
INTRAMUSCULAR | Status: AC | PRN
  Administered 2024-04-15: .5 mg via INTRAVENOUS

## 2024-04-15 MED ORDER — FLUMAZENIL 0.5 MG/5ML IV SOLN
INTRAVENOUS | Status: AC
  Filled 2024-04-15: qty 5

## 2024-04-15 MED ORDER — FENTANYL CITRATE (PF) 100 MCG/2ML IJ SOLN
INTRAMUSCULAR | Status: AC | PRN
  Administered 2024-04-15: 25 ug via INTRAVENOUS

## 2024-04-15 MED ORDER — SODIUM CHLORIDE 0.9 % IV SOLN: INTRAVENOUS | Status: DC

## 2024-04-15 MED ORDER — NALOXONE HCL 0.4 MG/ML IJ SOLN
INTRAMUSCULAR | Status: AC
  Filled 2024-04-15: qty 1

## 2024-04-15 MED ORDER — MIDAZOLAM HCL 2 MG/2ML IJ SOLN
INTRAMUSCULAR | Status: AC
  Filled 2024-04-15: qty 2

## 2024-04-15 MED ORDER — LIDOCAINE HCL 1 % IJ SOLN
INTRAMUSCULAR | Status: AC | PRN
  Administered 2024-04-15: 10 mL via INTRADERMAL

## 2024-04-15 NOTE — Procedures (Signed)
 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   Rickie Gutierres R. Philip, MD  Pager: [phone]

## 2024-04-15 NOTE — Discharge Instructions (Signed)

## 2024-04-19 LAB — SURGICAL PATHOLOGY

## 2024-04-22 ENCOUNTER — Encounter (HOSPITAL_COMMUNITY): Payer: Self-pay | Admitting: Oncology

## 2024-04-23 ENCOUNTER — Telehealth: Payer: Self-pay | Admitting: *Deleted

## 2024-04-23 ENCOUNTER — Inpatient Hospital Stay: Attending: Oncology | Admitting: Oncology

## 2024-04-23 VITALS — BP 132/75 | HR 72 | Temp 97.6°F | Resp 18 | Ht 68.0 in | Wt 127.6 lb

## 2024-04-23 DIAGNOSIS — C9000 Multiple myeloma not having achieved remission: Secondary | ICD-10-CM | POA: Insufficient documentation

## 2024-04-23 DIAGNOSIS — Z79899 Other long term (current) drug therapy: Secondary | ICD-10-CM | POA: Insufficient documentation

## 2024-04-23 DIAGNOSIS — I10 Essential (primary) hypertension: Secondary | ICD-10-CM | POA: Insufficient documentation

## 2024-04-23 DIAGNOSIS — D472 Monoclonal gammopathy: Secondary | ICD-10-CM | POA: Diagnosis not present

## 2024-04-23 DIAGNOSIS — N289 Disorder of kidney and ureter, unspecified: Secondary | ICD-10-CM | POA: Insufficient documentation

## 2024-04-23 DIAGNOSIS — D539 Nutritional anemia, unspecified: Secondary | ICD-10-CM | POA: Diagnosis not present

## 2024-04-23 NOTE — Progress Notes (Signed)
 [facility] OFFICE PROGRESS NOTE   Diagnosis: Multiple myeloma  INTERVAL HISTORY:   Rodney Elliott underwent a bone marrow biopsy on 04/15/2024.  He reports tolerated procedure well.  No complaint.  Objective:  Vital signs in last 24 hours:  Blood pressure 132/75, pulse 72, temperature 97.6 F (36.4 C), temperature source Temporal, resp. rate 18, height 5' 8 (1.727 m), weight 127 lb 9.6 oz (57.9 kg), SpO2 100%.   Resp: Lungs clear bilaterally, distant breath sounds, no respiratory distress Cardio: Rate and rhythm GI: No hepatosplenomegaly Vascular: No leg edema  Skin: Iliac bone marrow biopsy site without evidence of infection or bleeding  Lab Results:  Lab Results  Component Value Date   WBC 3.7 (L) 04/15/2024   HGB 10.3 (L) 04/15/2024   HCT 29.4 (L) 04/15/2024   MCV 108.1 (H) 04/15/2024   PLT 125 (L) 04/15/2024   NEUTROABS 2.7 04/15/2024    CMP  Lab Results  Component Value Date   NA 137 03/23/2024   K 4.7 03/23/2024   CL 103 03/23/2024   CO2 25 03/23/2024   GLUCOSE 183 (H) 03/23/2024   BUN 25 (H) 03/23/2024   CREATININE 2.22 (H) 03/23/2024   CALCIUM 10.6 (H) 03/23/2024   PROT 7.5 03/23/2024   ALBUMIN 3.9 03/23/2024   AST 35 03/23/2024   ALT 24 03/23/2024   ALKPHOS 75 03/23/2024   BILITOT 0.5 03/23/2024   GFRNONAA 27 (L) 03/23/2024   GFRAA 27 (L) 02/14/2020     Medications: I have reviewed the patient's current medications.   Assessment/Plan: Macrocytic anemia   Renal insufficiency   3.   Serum monoclonal IgG kappa protein Bone survey 12/29/2020-subtle lytic lesions in the calvarium and proximal left femur Progressive elevation of free kappa light chains and increased kappa light chain proteinuria March 2022 Bone marrow biopsy 02/12/2021- 5- 7% plasma cells, kappa light chain restricted, 46 X,-Y karyotype Bone marrow biopsy 04/15/2024: Hypercellular marrow with 18% plasma cells by manual differential, 30% by CD138, kappa restricted,  negative myeloma FISH panel   4.    Hypertension   5.    Idiopathic nonimmune hemolytic anemia/thrombocytopenia in 2014, spontaneously resolved  6.   Mild hypercalcemia       Disposition: Rodney Elliott has a serum monoclonal IgG kappa protein, chronic renal failure, anemia/thrombocytopenia, and chronic mild hypercalcemia.  A bone marrow biopsy 04/15/2024 reveals an increase in plasma cells compared to the bone marrow biopsy in 2022.  The myeloma markers have increased over the past several years.  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  He is asymptomatic.  He has chronic renal failure, likely unrelated to myeloma.  The creatinine has not changed significantly over many years.  I discussed the bone marrow findings with Rodney Elliott.  He understands we need to consider beginning treatment for multiple myeloma, but the hemoglobin has decreased minimally over the past several years, the creatinine is stable, the mild hypercalcemia is unchanged, and the last bone survey on 06/25/2022 revealed less prominent lytic lesions.  He will be referred for a diagnostic bone survey today. If the bone survey is negative I recommend continued observation.  If he has lytic bone lesions I recommend initiating systemic therapy.  I recommend Cytoxan/Velcade/Decadron due to his renal insufficiency.  We reviewed potential toxicities associated with this regimen including the chance of hematologic toxicity, infection, bleeding, nausea, diarrhea, mouth sores, and an allergic reaction.  We discussed the insomnia, hyperglycemia, and emotional lability associated with Decadron.  He agrees to proceed  if treatment is needed. We may also consider treatment with single agent daratumumab depending on the bone survey results.  Rodney Elliott will return for an office visit and further discussion in 2 weeks.  Arley Hof, MD  04/23/2024  2:09 PM

## 2024-04-23 NOTE — Telephone Encounter (Signed)
 Called Rodney Elliott and informed him that after more look over his history more, he wants to hold off on treatment. Will see what the bone survey shows and talk again at next visit before any final treatment decision is made. He was very grateful for the call

## 2024-04-27 ENCOUNTER — Encounter (HOSPITAL_COMMUNITY): Payer: Self-pay

## 2024-04-27 ENCOUNTER — Ambulatory Visit (HOSPITAL_COMMUNITY)
Admission: RE | Admit: 2024-04-27 | Discharge: 2024-04-27 | Disposition: A | Discharge status: Home / Self Care | Source: Ambulatory Visit | Attending: Oncology | Admitting: Oncology

## 2024-04-27 ENCOUNTER — Telehealth: Payer: Self-pay

## 2024-04-27 DIAGNOSIS — D472 Monoclonal gammopathy: Secondary | ICD-10-CM | POA: Insufficient documentation

## 2024-04-27 NOTE — Telephone Encounter (Signed)
 I am canceling the patient's lab and injection appointments scheduled for May 05, 2024, as per [facility] directives.

## 2024-05-05 ENCOUNTER — Inpatient Hospital Stay

## 2024-05-05 ENCOUNTER — Telehealth: Payer: Self-pay | Admitting: Nurse Practitioner

## 2024-05-05 ENCOUNTER — Inpatient Hospital Stay: Admitting: Nurse Practitioner

## 2024-05-05 NOTE — Telephone Encounter (Signed)
 [name] missed his appointment today.  We will call him to reschedule.

## 2024-05-07 ENCOUNTER — Encounter: Payer: Self-pay | Admitting: Nurse Practitioner

## 2024-05-07 ENCOUNTER — Inpatient Hospital Stay: Attending: Oncology | Admitting: Nurse Practitioner

## 2024-05-07 VITALS — BP 128/74 | HR 86 | Temp 97.9°F | Resp 18 | Ht 68.0 in | Wt 125.5 lb

## 2024-05-07 DIAGNOSIS — I10 Essential (primary) hypertension: Secondary | ICD-10-CM | POA: Insufficient documentation

## 2024-05-07 DIAGNOSIS — Z79899 Other long term (current) drug therapy: Secondary | ICD-10-CM | POA: Insufficient documentation

## 2024-05-07 DIAGNOSIS — C9000 Multiple myeloma not having achieved remission: Secondary | ICD-10-CM | POA: Insufficient documentation

## 2024-05-07 DIAGNOSIS — D472 Monoclonal gammopathy: Secondary | ICD-10-CM

## 2024-05-07 DIAGNOSIS — N289 Disorder of kidney and ureter, unspecified: Secondary | ICD-10-CM | POA: Insufficient documentation

## 2024-05-07 DIAGNOSIS — D539 Nutritional anemia, unspecified: Secondary | ICD-10-CM | POA: Insufficient documentation

## 2024-05-07 NOTE — Progress Notes (Signed)
  [facility] OFFICE PROGRESS NOTE   Diagnosis: Multiple myeloma  INTERVAL HISTORY:   Mr. Manolis returns for follow-up.  He feels well.  He has a good appetite.  He denies pain.  No fevers or sweats.  No recurrent infections.  Bowels move regularly with the aid of MiraLAX.  No nausea or vomiting.  Objective:  Vital signs in last 24 hours:  Blood pressure 128/74, pulse 86, temperature 97.9 F (36.6 C), temperature source Temporal, resp. rate 18, height 5' 8 (1.727 m), weight 125 lb 8 oz (56.9 kg), SpO2 100%.    HEENT: No thrush or ulcers. Resp: Lungs clear bilaterally. Cardio: Regular rate and rhythm. GI: Abdomen soft and nontender.  No hepatosplenomegaly. Vascular: No leg edema.   Lab Results:  Lab Results  Component Value Date   WBC 3.7 (L) 04/15/2024   HGB 10.3 (L) 04/15/2024   HCT 29.4 (L) 04/15/2024   MCV 108.1 (H) 04/15/2024   PLT 125 (L) 04/15/2024   NEUTROABS 2.7 04/15/2024    Imaging:  No results found.  Medications: I have reviewed the patient's current medications.  Assessment/Plan: Macrocytic anemia   Renal insufficiency   3.   Serum monoclonal IgG kappa protein Bone survey 12/29/2020-subtle lytic lesions in the calvarium and proximal left femur Progressive elevation of free kappa light chains and increased kappa light chain proteinuria March 2022 Bone marrow biopsy 02/12/2021- 5- 7% plasma cells, kappa light chain restricted, 46 X,-Y karyotype Bone marrow biopsy 04/15/2024: Hypercellular marrow with 18% plasma cells by manual differential, 30% by CD138, kappa restricted, negative myeloma FISH panel Bone survey 04/27/2024-questionable single new small lucent lesion within the proximal left humerus.  Small lytic lesions in the proximal left femur and calvarium unchanged from 2022.   4.    Hypertension   5.    Idiopathic nonimmune hemolytic anemia/thrombocytopenia in 2014, spontaneously resolved   6.   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  We discussed beginning treatment versus close observation.  He is comfortable with an observation approach.  He will return for repeat labs and an office visit in 6 weeks.  We are available to see him sooner if needed.  Patient seen with Dr. Cloretta.  Olam Ned ANP/GNP-BC   05/07/2024  8:11 AM  This was a shared visit with Olam Ned.  Mr. Bultman has a serum monoclonal IgG kappa protein and bone marrow plasmacytosis.  He has chronic renal insufficiency.  We reviewed the bone survey and indications for treatment with Mr. Tebbetts again today.  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  We discussed continued close observation versus initiating systemic therapy.  He would like to continue observation.  The plan is to follow his clinical and laboratory status closely and initiate systemic therapy as indicated.  I was present for greater than 50% of today's visit.  I performed medical decision making.  Arvella Cloretta, MD

## 2024-06-18 ENCOUNTER — Inpatient Hospital Stay (HOSPITAL_BASED_OUTPATIENT_CLINIC_OR_DEPARTMENT_OTHER): Admitting: Oncology

## 2024-06-18 ENCOUNTER — Inpatient Hospital Stay: Attending: Oncology

## 2024-06-18 ENCOUNTER — Inpatient Hospital Stay

## 2024-06-18 VITALS — BP 109/67 | HR 83 | Temp 98.5°F | Resp 18 | Ht 68.0 in | Wt 128.9 lb

## 2024-06-18 DIAGNOSIS — I10 Essential (primary) hypertension: Secondary | ICD-10-CM | POA: Diagnosis not present

## 2024-06-18 DIAGNOSIS — D6959 Other secondary thrombocytopenia: Secondary | ICD-10-CM | POA: Diagnosis not present

## 2024-06-18 DIAGNOSIS — D539 Nutritional anemia, unspecified: Secondary | ICD-10-CM | POA: Insufficient documentation

## 2024-06-18 DIAGNOSIS — D472 Monoclonal gammopathy: Secondary | ICD-10-CM | POA: Diagnosis present

## 2024-06-18 DIAGNOSIS — Z23 Encounter for immunization: Secondary | ICD-10-CM

## 2024-06-18 DIAGNOSIS — N289 Disorder of kidney and ureter, unspecified: Secondary | ICD-10-CM | POA: Insufficient documentation

## 2024-06-18 LAB — CBC WITH DIFFERENTIAL (CANCER CENTER ONLY)
Abs Immature Granulocytes: 0.01 K/uL (ref 0.00–0.07)
Basophils Absolute: 0 K/uL (ref 0.0–0.1)
Basophils Relative: 1 %
Eosinophils Absolute: 0.1 K/uL (ref 0.0–0.5)
Eosinophils Relative: 4 %
HCT: 27.8 % — ABNORMAL LOW (ref 39.0–52.0)
Hemoglobin: 9.7 g/dL — ABNORMAL LOW (ref 13.0–17.0)
Immature Granulocytes: 0 %
Lymphocytes Relative: 32 %
Lymphs Abs: 0.9 K/uL (ref 0.7–4.0)
MCH: 38 pg — ABNORMAL HIGH (ref 26.0–34.0)
MCHC: 34.9 g/dL (ref 30.0–36.0)
MCV: 109 fL — ABNORMAL HIGH (ref 80.0–100.0)
Monocytes Absolute: 0.2 K/uL (ref 0.1–1.0)
Monocytes Relative: 7 %
Neutro Abs: 1.6 K/uL — ABNORMAL LOW (ref 1.7–7.7)
Neutrophils Relative %: 56 %
Platelet Count: 142 K/uL — ABNORMAL LOW (ref 150–400)
RBC: 2.55 MIL/uL — ABNORMAL LOW (ref 4.22–5.81)
RDW: 12.6 % (ref 11.5–15.5)
WBC Count: 2.8 K/uL — ABNORMAL LOW (ref 4.0–10.5)
nRBC: 0 % (ref 0.0–0.2)

## 2024-06-18 LAB — CMP (CANCER CENTER ONLY)
ALT: 35 U/L (ref 0–44)
AST: 50 U/L — ABNORMAL HIGH (ref 15–41)
Albumin: 4.2 g/dL (ref 3.5–5.0)
Alkaline Phosphatase: 75 U/L (ref 38–126)
Anion gap: 11 (ref 5–15)
BUN: 24 mg/dL — ABNORMAL HIGH (ref 8–23)
CO2: 23 mmol/L (ref 22–32)
Calcium: 11 mg/dL — ABNORMAL HIGH (ref 8.9–10.3)
Chloride: 106 mmol/L (ref 98–111)
Creatinine: 2.25 mg/dL — ABNORMAL HIGH (ref 0.61–1.24)
GFR, Estimated: 27 mL/min — ABNORMAL LOW (ref >60)
Glucose, Bld: 150 mg/dL — ABNORMAL HIGH (ref 70–99)
Potassium: 5 mmol/L (ref 3.5–5.1)
Sodium: 139 mmol/L (ref 135–145)
Total Bilirubin: 0.9 mg/dL (ref 0.0–1.2)
Total Protein: 8.4 g/dL — ABNORMAL HIGH (ref 6.5–8.1)

## 2024-06-18 MED ORDER — INFLUENZA VAC SPLIT HIGH-DOSE 0.5 ML IM SUSY
0.5000 mL | PREFILLED_SYRINGE | Freq: Once | INTRAMUSCULAR | Status: AC
  Administered 2024-06-18: 0.5 mL via INTRAMUSCULAR
  Filled 2024-06-18: qty 0.5

## 2024-06-18 NOTE — Progress Notes (Signed)
  [facility] OFFICE PROGRESS NOTE   Diagnosis: Smoldering myeloma  INTERVAL HISTORY:   Rodney. Lodes returns as scheduled.  He feels well.  Good appetite.  No dyspnea or pain.  He feels stiff in the mornings.  This improves with ambulation.  Objective:  Vital signs in last 24 hours:  Blood pressure 109/67, pulse 83, temperature 98.5 F (36.9 C), temperature source Temporal, resp. rate 18, height 5' 8 (1.727 m), weight 128 lb 14.4 oz (58.5 kg), SpO2 100%.   Resp: Lungs clear bilaterally, decreased breath sounds at the left compared to the right chest, no respiratory distress Cardio: Regular rate and rhythm with premature beats GI: No hepatosplenomegaly, reducible left inguinal hernia Vascular: No leg edema   Lab Results:  Lab Results  Component Value Date   WBC 2.8 (L) 06/18/2024   HGB 9.7 (L) 06/18/2024   HCT 27.8 (L) 06/18/2024   MCV 109.0 (H) 06/18/2024   PLT 142 (L) 06/18/2024   NEUTROABS 1.6 (L) 06/18/2024    CMP  Lab Results  Component Value Date   NA 139 06/18/2024   K 5.0 06/18/2024   CL 106 06/18/2024   CO2 23 06/18/2024   GLUCOSE 150 (H) 06/18/2024   BUN 24 (H) 06/18/2024   CREATININE 2.25 (H) 06/18/2024   CALCIUM 11.0 (H) 06/18/2024   PROT 8.4 (H) 06/18/2024   ALBUMIN 4.2 06/18/2024   AST 50 (H) 06/18/2024   ALT 35 06/18/2024   ALKPHOS 75 06/18/2024   BILITOT 0.9 06/18/2024   GFRNONAA 27 (L) 06/18/2024   GFRAA 27 (L) 02/14/2020     Medications: I have reviewed the patient's current medications.   Assessment/Plan: Macrocytic anemia   Renal insufficiency   3.   Serum monoclonal IgG kappa protein Bone survey 12/29/2020-subtle lytic lesions in the calvarium and proximal left femur Progressive elevation of free kappa light chains and increased kappa light chain proteinuria March 2022 Bone marrow biopsy 02/12/2021- 5- 7% plasma cells, kappa light chain restricted, 46 X,-Y karyotype Bone marrow biopsy 04/15/2024: Hypercellular marrow with  18% plasma cells by manual differential, 30% by CD138, kappa restricted, negative myeloma FISH panel Bone survey 04/27/2024-questionable single new small lucent lesion within the proximal left humerus.  Small lytic lesions in the proximal left femur and calvarium unchanged from 2022.   4.    Hypertension   5.    Idiopathic nonimmune hemolytic anemia/thrombocytopenia in 2014, spontaneously resolved   6.   Mild hypercalcemia     Disposition: Rodney Elliott appears unchanged.  He is stable from a hematologic standpoint.  He has stable chronic renal failure and mild hypercalcemia.  We will follow-up on the myeloma panel from today.  The plan is to continue observation for the smoldering myeloma.  He will return for an office and lab visit in 6 weeks. Rodney. Goostree received an influenza vaccine today.  Arley Hof, MD  06/18/2024  10:20 AM

## 2024-06-19 LAB — IGG: IgG (Immunoglobin G), Serum: 2578 mg/dL — ABNORMAL HIGH (ref 603–1613)

## 2024-06-21 LAB — KAPPA/LAMBDA LIGHT CHAINS
Kappa free light chain: 3092.9 mg/L — ABNORMAL HIGH (ref 3.3–19.4)
Kappa, lambda light chain ratio: 162.78 — ABNORMAL HIGH (ref 0.26–1.65)
Lambda free light chains: 19 mg/L (ref 5.7–26.3)

## 2024-06-21 LAB — PROTEIN ELECTROPHORESIS, SERUM
A/G Ratio: 1 (ref 0.7–1.7)
Albumin ELP: 3.7 g/dL (ref 2.9–4.4)
Alpha-1-Globulin: 0.2 g/dL (ref 0.0–0.4)
Alpha-2-Globulin: 0.6 g/dL (ref 0.4–1.0)
Beta Globulin: 1 g/dL (ref 0.7–1.3)
Gamma Globulin: 2 g/dL — ABNORMAL HIGH (ref 0.4–1.8)
Globulin, Total: 3.8 g/dL (ref 2.2–3.9)
M-Spike, %: 1.7 g/dL — ABNORMAL HIGH
Total Protein ELP: 7.5 g/dL (ref 6.0–8.5)

## 2024-06-24 ENCOUNTER — Ambulatory Visit: Payer: Self-pay | Admitting: Oncology

## 2024-06-24 NOTE — Telephone Encounter (Signed)
 Patient gave verbal understanding and schedule to come in on the 06/29/24 at 945 am.

## 2024-06-24 NOTE — Telephone Encounter (Signed)
-----   Message from Arley Hof sent at 06/24/2024  4:24 PM EDT ----- Please call patient, the myeloma protein level returned higher, we need to begin treatment for myeloma soon.  Please schedule him with Olam in the a.m. on 06/29/2024  ----- Message ----- From: Debby Olam POUR, NP Sent: 06/21/2024   8:11 AM EDT To: Arley KATHEE Hof, MD   ----- Message ----- From: Rebecka, Lab [facility] [place] Sent: 06/18/2024   9:28 AM EDT To: Olam POUR Debby, NP

## 2024-06-29 ENCOUNTER — Encounter: Payer: Self-pay | Admitting: Nurse Practitioner

## 2024-06-29 ENCOUNTER — Inpatient Hospital Stay (HOSPITAL_BASED_OUTPATIENT_CLINIC_OR_DEPARTMENT_OTHER): Admitting: Nurse Practitioner

## 2024-06-29 VITALS — BP 139/81 | HR 92 | Temp 98.1°F | Resp 18 | Ht 68.0 in | Wt 127.9 lb

## 2024-06-29 DIAGNOSIS — D472 Monoclonal gammopathy: Secondary | ICD-10-CM | POA: Insufficient documentation

## 2024-06-29 MED ORDER — ACYCLOVIR 200 MG PO CAPS: 200.0000 mg | ORAL_CAPSULE | Freq: Two times a day (BID) | ORAL | 3 refills | Status: DC

## 2024-06-29 NOTE — Progress Notes (Signed)
  [facility] OFFICE PROGRESS NOTE   Diagnosis: Smoldering myeloma  INTERVAL HISTORY:   Rodney Elliott returns for follow-up.  He feels well.  Energy level is okay.  He has mild stable dyspnea on exertion.  Overall good appetite.  He denies neuropathy symptoms.  No diarrhea.  He takes MiraLAX daily at bedtime.  Objective:  Vital signs in last 24 hours:  Blood pressure 139/81, pulse 92, temperature 98.1 F (36.7 C), temperature source Temporal, resp. rate 18, height 5' 8 (1.727 m), weight 127 lb 14.4 oz (58 kg), SpO2 100%.    HEENT: No thrush or ulcers. Resp: Lungs clear bilaterally. Cardio: Regular rate and rhythm. GI: No hepatosplenomegaly.  Nontender. Vascular: No leg edema.   Lab Results:  Lab Results  Component Value Date   WBC 2.8 (L) 06/18/2024   HGB 9.7 (L) 06/18/2024   HCT 27.8 (L) 06/18/2024   MCV 109.0 (H) 06/18/2024   PLT 142 (L) 06/18/2024   NEUTROABS 1.6 (L) 06/18/2024    Imaging:  No results found.  Medications: I have reviewed the patient's current medications.  Assessment/Plan: Macrocytic anemia   Renal insufficiency   3.   Serum monoclonal IgG kappa protein Bone survey 12/29/2020-subtle lytic lesions in the calvarium and proximal left femur Progressive elevation of free kappa light chains and increased kappa light chain proteinuria March 2022 Bone marrow biopsy 02/12/2021- 5- 7% plasma cells, kappa light chain restricted, 46 X,-Y karyotype Bone marrow biopsy 04/15/2024: Hypercellular marrow with 18% plasma cells by manual differential, 30% by CD138, kappa restricted, negative myeloma FISH panel Bone survey 04/27/2024-questionable single new small lucent lesion within the proximal left humerus.  Small lytic lesions in the proximal left femur and calvarium unchanged from 2022. 06/18/2024-IgG, kappa free light chains, M spike with progressive rise   4.    Hypertension   5.    Idiopathic nonimmune hemolytic anemia/thrombocytopenia in 2014,  spontaneously resolved   6.   Mild hypercalcemia  Disposition: Rodney Elliott appears stable.  We reviewed the myeloma panel from 06/18/2024.  He understands there has been a progressive rise in the IgG, kappa free light chains and M spike.  Dr. Cloretta recommends beginning treatment with CyBorD.  We reviewed potential side effects including bone marrow toxicity, nausea, hair loss, mouth sores, diarrhea or constipation, rash.  We reviewed the neuropathy associated with Velcade.  We reviewed potential side effects associated with steroids.  He understands the rationale for antiviral prophylaxis with acyclovir .  Prescription sent to his pharmacy.  We discussed the treatment schedule being 3 weeks on/1 week off.  He will attend a chemotherapy education class.  He will return for follow-up and cycle 1 day 1 CyBorD 07/07/2024.  We are available to see him sooner if needed.  Patient seen with Dr. Cloretta.    Olam Ned ANP/GNP-BC   06/29/2024  9:53 AM  This was a shared visit with Olam Ned.  Rodney Elliott has multiple myeloma.  He appears to have smoldering multiple myeloma.  The IgG, serum M spike, and kappa light chains increased on the myeloma panel earlier this month.  We discussed treatment options with Rodney Elliott he agrees to begin systemic therapy.  I recommend Cytoxan, Velcade, Decadron.  We reviewed potential toxicities associated with this regimen including the chance of hematologic toxicity, infection, bleeding, and neuropathy.  He agrees to proceed.  The plan is to begin treatment with CyBorD on a day 1, day 8, day 15 schedule.  A treatment plan was entered today.  I was present for greater than 50% of today's visit.  I performed medical decision making.  Arvella Hof, MD

## 2024-06-29 NOTE — Progress Notes (Signed)
 START ON PATHWAY REGIMEN - Multiple Myeloma and Other Plasma Cell Dyscrasias     A cycle is every 28 days:     Cyclophosphamide      Dexamethasone      Bortezomib   **Always confirm dose/schedule in your pharmacy ordering system**  Patient Characteristics: Multiple Myeloma, Newly Diagnosed, Transplant Ineligible or Deferred, Standard Risk Disease Classification: Multiple Myeloma Therapeutic Status: Newly Diagnosed R2-ISS Staging: Unknown Is Patient Eligible for Transplant<= Transplant Ineligible or Deferred Risk Status: Standard Risk Intent of Therapy: Non-Curative / Palliative Intent, Discussed with Patient

## 2024-06-30 ENCOUNTER — Other Ambulatory Visit: Payer: Self-pay

## 2024-06-30 NOTE — Progress Notes (Signed)
 Pharmacist Chemotherapy Monitoring - Initial Assessment    Anticipated start date: 07/07/24   The following has been reviewed per standard work regarding the patient's treatment regimen: The patient's diagnosis, treatment plan and drug doses, and organ/hematologic function Lab orders and baseline tests specific to treatment regimen  The treatment plan start date, drug sequencing, and pre-medications Prior authorization status  Patient's documented medication list, including drug-drug interaction screen and prescriptions for anti-emetics and supportive care specific to the treatment regimen The drug concentrations, fluid compatibility, administration routes, and timing of the medications to be used The patient's access for treatment and lifetime cumulative dose history, if applicable  The patient's medication allergies and previous infusion related reactions, if applicable   Changes made to treatment plan:  N/A  Follow up needed:  Pending authorization for treatment    Naji Mehringer Kreul, [facility], 06/30/2024  12:17 PM

## 2024-07-02 ENCOUNTER — Inpatient Hospital Stay

## 2024-07-02 NOTE — Progress Notes (Signed)
 PATIENT NAVIGATOR PROGRESS NOTE  Name: Rodney Elliott Date: 07/02/2024 MRN: [id number]  DOB: 12-25-1927   Reason for visit:  Patient education session  Comments:  Attended pt ed session, please see education tab    Time spent counseling/coordinating care: > 60 minutes

## 2024-07-04 ENCOUNTER — Other Ambulatory Visit: Payer: Self-pay | Admitting: Oncology

## 2024-07-05 ENCOUNTER — Other Ambulatory Visit: Payer: Self-pay | Admitting: Oncology

## 2024-07-06 ENCOUNTER — Telehealth: Payer: Self-pay | Admitting: Oncology

## 2024-07-06 ENCOUNTER — Encounter: Payer: Self-pay | Admitting: Oncology

## 2024-07-06 ENCOUNTER — Other Ambulatory Visit: Payer: Self-pay

## 2024-07-06 ENCOUNTER — Telehealth: Payer: Self-pay

## 2024-07-06 ENCOUNTER — Other Ambulatory Visit (HOSPITAL_COMMUNITY): Payer: Self-pay

## 2024-07-06 ENCOUNTER — Other Ambulatory Visit: Payer: Self-pay | Admitting: Oncology

## 2024-07-06 MED ORDER — CYCLOPHOSPHAMIDE 50 MG PO CAPS
175.0000 mg/m2 | ORAL_CAPSULE | ORAL | 0 refills | Status: DC
  Filled 2024-07-06 – 2024-07-07 (×2): qty 18, 21d supply, fill #0

## 2024-07-06 NOTE — Telephone Encounter (Signed)
 The patient contacted the office and reports that he is at the pharmacy attempting to pick up his medication and there is not any medication ready for him. The Cytoxan was sent to [facility], and the pharmacy technician informed him that prior authorization is required. I informed the patient that the specialty pharmacy is working to obtain the necessary approval from his insurance. According to Dr. Cloretta, it is acceptable for the patient to attend his upcoming appointment on July 07, 2024 to proceed with treatment.

## 2024-07-06 NOTE — Telephone Encounter (Signed)
 PT called to ask questions about his prescription to bring to this appt tomorrow.

## 2024-07-06 NOTE — Progress Notes (Unsigned)
 [facility]   [phone] [phone]    Patient Care Team: Valentin Skates, DO as PCP - General (Internal Medicine)   CHIEF COMPLAINT: Follow up smoldering myeloma   CURRENT THERAPY: CyBorD, starting 07/07/24  INTERVAL HISTORY Rodney Elliott returns for follow up and treatment. Doing well overall, denies pain. Eating/drinking well. Bowels moving with miralax. Denies baseline neuropathy. Has not received cytoxan yet.   ROS  All other systems reviewed and negative  Past Medical History:  Diagnosis Date   Asthma    Cancer (HCC)    Hypertension    Tubular adenoma of colon    2010     Past Surgical History:  Procedure Laterality Date   TONSILLECTOMY     TUMOR REMOVAL  1996   Mandible     Outpatient Encounter Medications as of 07/07/2024  Medication Sig Note   acyclovir  (ZOVIRAX ) 200 MG capsule Take 1 capsule (200 mg total) by mouth 2 (two) times daily.    ADVAIR DISKUS 100-50 MCG/DOSE AEPB Inhale 1 puff into the lungs daily.    albuterol (VENTOLIN HFA) 108 (90 Base) MCG/ACT inhaler Inhale 1 puff into the lungs every 4 (four) hours as needed.    amLODipine (NORVASC) 5 MG tablet Take 5 mg by mouth daily.    Cholecalciferol (D3-1000) 25 MCG (1000 UT) capsule Take 1,000 Units by mouth daily.    doxazosin (CARDURA) 8 MG tablet Take 8 mg by mouth daily. 04/12/2016: Received from: External Pharmacy   fexofenadine (ALLEGRA) 180 MG tablet Take 180 mg by mouth daily. Alternates w/claritin    folic acid (FOLVITE) 1 MG tablet Take 1 mg by mouth daily. 04/12/2016: Received from: External Pharmacy   irbesartan (AVAPRO) 150 MG tablet Take 150 mg by mouth daily.    levothyroxine (SYNTHROID) 50 MCG tablet Take 50 mcg by mouth daily before breakfast.    loratadine (CLARITIN) 10 MG tablet Take 10 mg by mouth daily. 06/25/2022: Alternates with Allegra   metoprolol succinate (TOPROL-XL) 50 MG 24 hr tablet Take 50 mg by mouth daily. 04/12/2016: Received from: External  Pharmacy   montelukast (SINGULAIR) 10 MG tablet Take 10 mg by mouth daily.    polyethylene glycol (MIRALAX / GLYCOLAX) 17 g packet Take 17 g by mouth daily.    [DISCONTINUED] cyclophosphamide (CYTOXAN) 50 MG capsule Take 6 capsules (300 mg total) by mouth once a week. Take at the [facility] when instructed by nurse    No facility-administered encounter medications on file as of 07/07/2024.     Today's Vitals   07/07/24 0900 07/07/24 0925  BP:  (!) 129/57  Pulse:  88  Resp:  16  Temp:  97.9 F (36.6 C)  TempSrc:  Temporal  SpO2:  100%  Weight:  126 lb 11.2 oz (57.5 kg)  Height:  5' 8 (1.727 m)  PainSc: 0-No pain    Body mass index is 19.26 kg/m.   ECOG PERFORMANCE STATUS: 1 - Symptomatic but completely ambulatory  PHYSICAL EXAM GENERAL:alert, no distress and comfortable SKIN: no rash  EYES: sclera clear LUNGS: clear with normal breathing effort HEART: regular rate & rhythm NEURO: alert & oriented x 3 with fluent speech   CBC    Latest Ref Rng & Units 07/07/2024    8:56 AM 06/18/2024    9:17 AM 04/15/2024    8:30 AM  CBC  WBC 4.0 - 10.5 K/uL 2.9  2.8  3.7   Hemoglobin 13.0 - 17.0 g/dL 9.5  9.7  10.3   Hematocrit 39.0 - 52.0 % 26.8  27.8  29.4   Platelets 150 - 400 K/uL 125  142  125       CMP     Latest Ref Rng & Units 07/07/2024    8:56 AM 06/18/2024    9:17 AM 03/23/2024   11:20 AM  CMP  Glucose 70 - 99 mg/dL 818  849  816   BUN 8 - 23 mg/dL 22  24  25    Creatinine 0.61 - 1.24 mg/dL 7.81  7.74  7.77   Sodium 135 - 145 mmol/L 137  139  137   Potassium 3.5 - 5.1 mmol/L 4.3  5.0  4.7   Chloride 98 - 111 mmol/L 102  106  103   CO2 22 - 32 mmol/L 23  23  25    Calcium 8.9 - 10.3 mg/dL 88.9  88.9  89.3   Total Protein 6.5 - 8.1 g/dL 8.1  8.4  7.5   Total Bilirubin 0.0 - 1.2 mg/dL 0.9  0.9  0.5   Alkaline Phos 38 - 126 U/L 73  75  75   AST 15 - 41 U/L 36  50  35   ALT 0 - 44 U/L 22  35  24       ASSESSMENT & PLAN:  Macrocytic anemia   2. Renal  insufficiency   3.   Serum monoclonal IgG kappa protein Bone survey 12/29/2020-subtle lytic lesions in the calvarium and proximal left femur Progressive elevation of free kappa light chains and increased kappa light chain proteinuria March 2022 Bone marrow biopsy 02/12/2021- 5- 7% plasma cells, kappa light chain restricted, 46 X,-Y karyotype Bone marrow biopsy 04/15/2024: Hypercellular marrow with 18% plasma cells by manual differential, 30% by CD138, kappa restricted, negative myeloma FISH panel Bone survey 04/27/2024-questionable single new small lucent lesion within the proximal left humerus.  Small lytic lesions in the proximal left femur and calvarium unchanged from 2022. 06/18/2024-IgG, kappa free light chains, M spike with progressive rise Acyclovir  prophylaxis  Cycle 1 day 1 velcade/Dex, cytoxan has not been received yet   4.    Hypertension   5.    Idiopathic nonimmune hemolytic anemia/thrombocytopenia in 2014, spontaneously resolved   6.   Mild hypercalcemia    Disposition:  [name] appears stable. Labs reviewed. Ready to proceed with cycle 1 day 1 Velcade/Dex. We reviewed potential side effects and symptom management. He will take Cytoxan at home when he receives it from [facility] pharmacy.   Return for lab and cycle 1 day 8 CyBorD next week as scheduled, or sooner if needed.     All questions were answered. The patient knows to call the clinic with any problems, questions or concerns. No barriers to learning were detected.   Leida Luton K Jayten Gabbard, NP 07/07/2024

## 2024-07-07 ENCOUNTER — Encounter: Payer: Self-pay | Admitting: Nurse Practitioner

## 2024-07-07 ENCOUNTER — Other Ambulatory Visit: Payer: Self-pay | Admitting: Pharmacy Technician

## 2024-07-07 ENCOUNTER — Other Ambulatory Visit (HOSPITAL_COMMUNITY): Payer: Self-pay

## 2024-07-07 ENCOUNTER — Telehealth: Payer: Self-pay | Admitting: Pharmacy Technician

## 2024-07-07 ENCOUNTER — Other Ambulatory Visit: Payer: Self-pay

## 2024-07-07 ENCOUNTER — Telehealth: Payer: Self-pay | Admitting: Pharmacist

## 2024-07-07 ENCOUNTER — Encounter: Payer: Self-pay | Admitting: Oncology

## 2024-07-07 ENCOUNTER — Inpatient Hospital Stay: Attending: Oncology

## 2024-07-07 ENCOUNTER — Inpatient Hospital Stay

## 2024-07-07 ENCOUNTER — Inpatient Hospital Stay (HOSPITAL_BASED_OUTPATIENT_CLINIC_OR_DEPARTMENT_OTHER): Admitting: Nurse Practitioner

## 2024-07-07 VITALS — BP 129/57 | HR 88 | Temp 97.9°F | Resp 16 | Ht 68.0 in | Wt 126.7 lb

## 2024-07-07 DIAGNOSIS — D472 Monoclonal gammopathy: Secondary | ICD-10-CM

## 2024-07-07 DIAGNOSIS — N289 Disorder of kidney and ureter, unspecified: Secondary | ICD-10-CM | POA: Insufficient documentation

## 2024-07-07 DIAGNOSIS — I10 Essential (primary) hypertension: Secondary | ICD-10-CM | POA: Insufficient documentation

## 2024-07-07 DIAGNOSIS — Z79890 Hormone replacement therapy (postmenopausal): Secondary | ICD-10-CM | POA: Diagnosis not present

## 2024-07-07 DIAGNOSIS — D6959 Other secondary thrombocytopenia: Secondary | ICD-10-CM | POA: Diagnosis not present

## 2024-07-07 DIAGNOSIS — Z79899 Other long term (current) drug therapy: Secondary | ICD-10-CM | POA: Insufficient documentation

## 2024-07-07 DIAGNOSIS — R531 Weakness: Secondary | ICD-10-CM | POA: Diagnosis not present

## 2024-07-07 DIAGNOSIS — R5383 Other fatigue: Secondary | ICD-10-CM | POA: Diagnosis not present

## 2024-07-07 DIAGNOSIS — D539 Nutritional anemia, unspecified: Secondary | ICD-10-CM | POA: Insufficient documentation

## 2024-07-07 DIAGNOSIS — Z79624 Long term (current) use of inhibitors of nucleotide synthesis: Secondary | ICD-10-CM | POA: Insufficient documentation

## 2024-07-07 DIAGNOSIS — Z860101 Personal history of adenomatous and serrated colon polyps: Secondary | ICD-10-CM | POA: Diagnosis not present

## 2024-07-07 DIAGNOSIS — Z5112 Encounter for antineoplastic immunotherapy: Secondary | ICD-10-CM | POA: Diagnosis present

## 2024-07-07 DIAGNOSIS — Z9089 Acquired absence of other organs: Secondary | ICD-10-CM | POA: Insufficient documentation

## 2024-07-07 LAB — CBC WITH DIFFERENTIAL (CANCER CENTER ONLY)
Abs Immature Granulocytes: 0 K/uL (ref 0.00–0.07)
Basophils Absolute: 0 K/uL (ref 0.0–0.1)
Basophils Relative: 1 %
Eosinophils Absolute: 0.1 K/uL (ref 0.0–0.5)
Eosinophils Relative: 2 %
HCT: 26.8 % — ABNORMAL LOW (ref 39.0–52.0)
Hemoglobin: 9.5 g/dL — ABNORMAL LOW (ref 13.0–17.0)
Immature Granulocytes: 0 %
Lymphocytes Relative: 31 %
Lymphs Abs: 0.9 K/uL (ref 0.7–4.0)
MCH: 38.2 pg — ABNORMAL HIGH (ref 26.0–34.0)
MCHC: 35.4 g/dL (ref 30.0–36.0)
MCV: 107.6 fL — ABNORMAL HIGH (ref 80.0–100.0)
Monocytes Absolute: 0.2 K/uL (ref 0.1–1.0)
Monocytes Relative: 8 %
Neutro Abs: 1.6 K/uL — ABNORMAL LOW (ref 1.7–7.7)
Neutrophils Relative %: 58 %
Platelet Count: 125 K/uL — ABNORMAL LOW (ref 150–400)
RBC: 2.49 MIL/uL — ABNORMAL LOW (ref 4.22–5.81)
RDW: 11.6 % (ref 11.5–15.5)
WBC Count: 2.9 K/uL — ABNORMAL LOW (ref 4.0–10.5)
nRBC: 0 % (ref 0.0–0.2)

## 2024-07-07 LAB — CMP (CANCER CENTER ONLY)
ALT: 22 U/L (ref 0–44)
AST: 36 U/L (ref 15–41)
Albumin: 3.9 g/dL (ref 3.5–5.0)
Alkaline Phosphatase: 73 U/L (ref 38–126)
Anion gap: 11 (ref 5–15)
BUN: 22 mg/dL (ref 8–23)
CO2: 23 mmol/L (ref 22–32)
Calcium: 11 mg/dL — ABNORMAL HIGH (ref 8.9–10.3)
Chloride: 102 mmol/L (ref 98–111)
Creatinine: 2.18 mg/dL — ABNORMAL HIGH (ref 0.61–1.24)
GFR, Estimated: 28 mL/min — ABNORMAL LOW (ref >60)
Glucose, Bld: 181 mg/dL — ABNORMAL HIGH (ref 70–99)
Potassium: 4.3 mmol/L (ref 3.5–5.1)
Sodium: 137 mmol/L (ref 135–145)
Total Bilirubin: 0.9 mg/dL (ref 0.0–1.2)
Total Protein: 8.1 g/dL (ref 6.5–8.1)

## 2024-07-07 MED ORDER — PROCHLORPERAZINE MALEATE 5 MG PO TABS
5.0000 mg | ORAL_TABLET | Freq: Four times a day (QID) | ORAL | 0 refills | Status: AC | PRN
  Filled 2024-07-07: qty 20, 5d supply, fill #0

## 2024-07-07 MED ORDER — DEXAMETHASONE 4 MG PO TABS
20.0000 mg | ORAL_TABLET | Freq: Once | ORAL | Status: AC
  Administered 2024-07-07: 20 mg via ORAL
  Filled 2024-07-07: qty 5

## 2024-07-07 MED ORDER — ONDANSETRON HCL 4 MG PO TABS
4.0000 mg | ORAL_TABLET | Freq: Three times a day (TID) | ORAL | 0 refills | Status: DC | PRN
  Filled 2024-07-07: qty 20, 7d supply, fill #0

## 2024-07-07 MED ORDER — CYCLOPHOSPHAMIDE 50 MG PO CAPS
175.0000 mg/m2 | ORAL_CAPSULE | ORAL | 0 refills | Status: DC
  Filled 2024-07-07: qty 18, 28d supply, fill #0
  Filled 2024-07-07: qty 18, 21d supply, fill #0
  Filled 2024-07-07: qty 18, 28d supply, fill #0
  Filled 2024-07-07: qty 18, 21d supply, fill #0

## 2024-07-07 MED ORDER — BORTEZOMIB CHEMO SQ INJECTION 3.5 MG (2.5MG/ML)
1.3000 mg/m2 | Freq: Once | INTRAMUSCULAR | Status: AC
  Administered 2024-07-07: 2.25 mg via SUBCUTANEOUS
  Filled 2024-07-07: qty 0.9

## 2024-07-07 NOTE — Telephone Encounter (Signed)
 Oral Oncology Patient Advocate Encounter  Patient successfully OnBoarded and drug education provided by pharmacist. Medication scheduled to be filled on 10/01 for pickup on 10/01 from [facility]. Patient also knows to call me at [phone] with any questions or concerns regarding receiving medication or if there is any unexpected change in co-pay.    Rodney Elliott ([name]) [name], CPhT  [facility], [name], [name] - Oral Chemotherapy Patient Advocate Specialist III Phone: [phone]  Fax: [phone]

## 2024-07-07 NOTE — Progress Notes (Signed)
 Patient seen by Lacie Burton, NP today  Vitals are within treatment parameters:Yes   Labs are within treatment parameters: Yes WBC 2.9 Neut 1.6 Creatinine 2.18 its okay to proceed  Treatment plan has been signed: Yes   Per physician team, Patient is ready for treatment and there are NO modifications to the treatment plan.

## 2024-07-07 NOTE — Progress Notes (Signed)
 Patient education documented in EPIC note on 07/07/24.

## 2024-07-07 NOTE — Progress Notes (Signed)
 Specialty Pharmacy Initial Fill Coordination Note  Rodney Elliott is a 89 y.o. male contacted today regarding refills of specialty medication(s) cycloPHOSphamide (CYTOXAN) .  Patient requested [name] at [facility] at [place]  on 07/07/24   Medication will be filled on 07/07/2024.   Patient is aware of $12.70 copayment.   [name] ([name]) [name], CPhT  [name], [name], [name] - Oral Chemotherapy Patient Advocate Specialist III Phone: [phone]  Fax: [phone]

## 2024-07-07 NOTE — Telephone Encounter (Signed)
 Clinical Pharmacist Practitioner Encounter   Received new prescription for Cytoxan (cyclophosphamide) for the treatment of multiple myeloma  in conjunction with bortezomib and dexamethasone, planned duration until disease control or unacceptable drug toxicity.  CMP from 07/07/24 assessed, SCr 2.18 and CrCl ~17. Prescription dose and frequency assessed. Dose has been appropriately dose reduced for his renal impairment (reduce to ~75% of normal dose).   Current medication list in Epic reviewed, no DDIs with cyclophosphamide identified.   Evaluated chart and no patient barriers to medication adherence identified.   Prescription has been e-scribed to the [facility] for benefits analysis and approval.  Oral Oncology Clinic will continue to follow for insurance authorization, copayment issues, initial counseling and start date.   Rodney Elliott N. Corleone Biegler, PharmD, BCOP, CPP Hematology/Oncology Clinical Pharmacist [facility] Oral Chemotherapy Navigation Clinic [phone]  07/07/2024 12:09 PM

## 2024-07-07 NOTE — Telephone Encounter (Signed)
 Oral Oncology Patient Advocate Encounter   New authorization   Received notification that prior authorization for Cyclophosphamide is required.   PA submitted on CMM via Latent [employer] [id number] Status is pending     Rodney Elliott ([name]) [name], CPhT  [facility], [name], [name] - Oral Chemotherapy Patient Advocate Specialist III Phone: [phone]  Fax: [phone]

## 2024-07-07 NOTE — Telephone Encounter (Signed)
 Clinical Pharmacist Practitioner Encounter   Patient will pick up medication from [facility] (Special[facility] today to get started.   Patient Education I spoke with patient for overview of new oral chemotherapy medication: Cytoxan (cyclophosphamide) for the treatment of multiple myeloma  in conjunction with bortezomib and dexamethasone, planned duration until disease control or unacceptable drug toxicity.   Treatment goal: Palliative  Counseled patient on administration, dosing, side effects, monitoring, drug-food interactions, safe handling, storage, and disposal. Patient will take 6 capsules (300 mg total) by mouth once a week. Take weekly for 3 weeks, then hold for 1 week. Repeat every 4 weeks. Take at the [facility] when instructed by nurse.  Side effects include but not limited to: nausea, decreased wbc/hgb/plt, hair loss.    Reviewed with patient importance of keeping a medication schedule and plan for any missed doses.  After discussion with patient no patient barriers to medication adherence identified.   Distress evaluation: already completed during chemo education class  Communication and Learning Assessment Primary learner: patient Barriers to learning: No barriers Preferred language: English Learning preferences: Listening Reading   Mr. Lords voiced understanding and appreciation. All questions answered. Medication handout provided.  Provided patient with Oral Chemotherapy Navigation Clinic phone number. Patient knows to call the office with questions or concerns. Oral Chemotherapy Navigation Clinic will continue to follow.  Eliyanah Elgersma N. Marjo Grosvenor, PharmD, BCOP, CPP Hematology/Oncology Clinical Pharmacist [facility] Oral Chemotherapy Navigation Clinic [phone]  07/07/2024 2:19 PM

## 2024-07-07 NOTE — Patient Instructions (Signed)
 [facility] - A DEPT OF . [facility]  Discharge Instructions: Thank you for choosing [facility] to provide your oncology and hematology care.   If you have a lab appointment with the Cancer Center, please go directly to the Cancer Center and check in at the registration area.   Wear comfortable clothing and clothing appropriate for easy access to any Portacath or PICC line.   We strive to give you quality time with your provider. You may need to reschedule your appointment if you arrive late (15 or more minutes).  Arriving late affects you and other patients whose appointments are after yours.  Also, if you miss three or more appointments without notifying the office, you may be dismissed from the clinic at the provider's discretion.      For prescription refill requests, have your pharmacy contact our office and allow 72 hours for refills to be completed.    Today you received the following chemotherapy and/or immunotherapy agents: velcade      Bortezomib  Injection What is this medication? BORTEZOMIB  (bor TEZ oh mib) treats lymphoma. It may also be used to treat multiple myeloma, a type of bone marrow cancer. It works by blocking a protein that causes cancer cells to grow and multiply. This helps to slow or stop the spread of cancer cells. This medicine may be used for other purposes; ask your health care provider or pharmacist if you have questions. COMMON BRAND NAME(S): BORUZU , Velcade  What should I tell my care team before I take this medication? They need to know if you have any of these conditions: Dehydration Diabetes Heart disease Liver disease Tingling of the fingers or toes or other nerve disorder An unusual or allergic reaction to bortezomib , other medications, foods, dyes, or preservatives If you or your partner are pregnant or trying to get pregnant Breastfeeding How should I use this medication? This medication is injected into  a vein or under the skin. It is given by your care team in a hospital or clinic setting. Talk to your care team about the use of this medication in children. Special care may be needed. Overdosage: If you think you have taken too much of this medicine contact a poison control center or emergency room at once. NOTE: This medicine is only for you. Do not share this medicine with others. What if I miss a dose? Keep appointments for follow-up doses. It is important not to miss your dose. Call your care team if you are unable to keep an appointment. What may interact with this medication? Ketoconazole Rifampin This list may not describe all possible interactions. Give your health care provider a list of all the medicines, herbs, non-prescription drugs, or dietary supplements you use. Also tell them if you smoke, drink alcohol, or use illegal drugs. Some items may interact with your medicine. What should I watch for while using this medication? Your condition will be monitored carefully while you are receiving this medication. You may need blood work while taking this medication. This medication may affect your coordination, reaction time, or judgment. Do not drive or operate machinery until you know how this medication affects you. Sit up or stand slowly to reduce the risk of dizzy or fainting spells. Drinking alcohol with this medication can increase the risk of these side effects. This medication may increase your risk of getting an infection. Call your care team for advice if you get a fever, chills, sore throat, or other symptoms of  a cold or flu. Do not treat yourself. Try to avoid being around people who are sick. Check with your care team if you have severe diarrhea, nausea, and vomiting, or if you sweat a lot. The loss of too much body fluid may make it dangerous for you to take this medication. Talk to your care team if you may be pregnant. Serious birth defects can occur if you take this  medication during pregnancy and for 7 months after the last dose. You will need a negative pregnancy test before starting this medication. Contraception is recommended while taking this medication and for 7 months after the last dose. Your care team can help you find the option that works for you. If your partner can get pregnant, use a condom during sex while taking this medication and for 4 months after the last dose. Do not breastfeed while taking this medication and for 2 months after the last dose. This medication may cause infertility. Talk to your care team if you are concerned about your fertility. What side effects may I notice from receiving this medication? Side effects that you should report to your care team as soon as possible: Allergic reactions--skin rash, itching, hives, swelling of the face, lips, tongue, or throat Bleeding--bloody or black, tar-like stools, vomiting blood or brown material that looks like coffee grounds, red or dark brown urine, small red or purple spots on skin, unusual bruising or bleeding Bleeding in the brain--severe headache, stiff neck, confusion, dizziness, change in vision, numbness or weakness of the face, arm, or leg, trouble speaking, trouble walking, vomiting Bowel blockage--stomach cramping, unable to have a bowel movement or pass gas, loss of appetite, vomiting Heart failure--shortness of breath, swelling of the ankles, feet, or hands, sudden weight gain, unusual weakness or fatigue Infection--fever, chills, cough, sore throat, wounds that don't heal, pain or trouble when passing urine, general feeling of discomfort or being unwell Liver injury--right upper belly pain, loss of appetite, nausea, light-colored stool, dark yellow or brown urine, yellowing skin or eyes, unusual weakness or fatigue Low blood pressure--dizziness, feeling faint or lightheaded, blurry vision Lung injury--shortness of breath or trouble breathing, cough, spitting up blood, chest  pain, fever Pain, tingling, or numbness in the hands or feet Severe or prolonged diarrhea Stomach pain, bloody diarrhea, pale skin, unusual weakness or fatigue, decrease in the amount of urine, which may be signs of hemolytic uremic syndrome Sudden and severe headache, confusion, change in vision, seizures, which may be signs of posterior reversible encephalopathy syndrome (PRES) TTP--purple spots on the skin or inside the mouth, pale skin, yellowing skin or eyes, unusual weakness or fatigue, fever, fast or irregular heartbeat, confusion, change in vision, trouble speaking, trouble walking Tumor lysis syndrome (TLS)--nausea, vomiting, diarrhea, decrease in the amount of urine, dark urine, unusual weakness or fatigue, confusion, muscle pain or cramps, fast or irregular heartbeat, joint pain Side effects that usually do not require medical attention (report to your care team if they continue or are bothersome): Constipation Diarrhea Fatigue Loss of appetite Nausea This list may not describe all possible side effects. Call your doctor for medical advice about side effects. You may report side effects to FDA at 1-800-FDA-1088. Where should I keep my medication? This medication is given in a hospital or clinic. It will not be stored at home. NOTE: This sheet is a summary. It may not cover all possible information. If you have questions about this medicine, talk to your doctor, pharmacist, or health care provider.  2024  Elsevier/Gold Standard (2022-02-26 00:00:00)   To help prevent nausea and vomiting after your treatment, we encourage you to take your nausea medication as directed.  BELOW ARE SYMPTOMS THAT SHOULD BE REPORTED IMMEDIATELY: *FEVER GREATER THAN 100.4 F (38 C) OR HIGHER *CHILLS OR SWEATING *NAUSEA AND VOMITING THAT IS NOT CONTROLLED WITH YOUR NAUSEA MEDICATION *UNUSUAL SHORTNESS OF BREATH *UNUSUAL BRUISING OR BLEEDING *URINARY PROBLEMS (pain or burning when urinating, or frequent  urination) *BOWEL PROBLEMS (unusual diarrhea, constipation, pain near the anus) TENDERNESS IN MOUTH AND THROAT WITH OR WITHOUT PRESENCE OF ULCERS (sore throat, sores in mouth, or a toothache) UNUSUAL RASH, SWELLING OR PAIN  UNUSUAL VAGINAL DISCHARGE OR ITCHING   Items with * indicate a potential emergency and should be followed up as soon as possible or go to the Emergency Department if any problems should occur.  Please show the CHEMOTHERAPY ALERT CARD or IMMUNOTHERAPY ALERT CARD at check-in to the Emergency Department and triage nurse.  Should you have questions after your visit or need to cancel or reschedule your appointment, please contact [facility]  Dept: [phone]  and follow the prompts.  Office hours are 8:00 a.m. to 4:30 p.m. Monday - Friday. Please note that voicemails left after 4:00 p.m. may not be returned until the following business day.  We are closed weekends and major holidays. You have access to a nurse at all times for urgent questions. Please call the main number to the clinic Dept: [phone] and follow the prompts.   For any non-urgent questions, you may also contact your provider using MyChart. We now offer e-Visits for anyone 78 and older to request care online for non-urgent symptoms. For details visit [email or web address].   Also download the MyChart app! Go to the app store, search MyChart, open the app, select [facility], and log in with your MyChart username and password.

## 2024-07-08 ENCOUNTER — Telehealth: Payer: Self-pay

## 2024-07-08 ENCOUNTER — Other Ambulatory Visit (HOSPITAL_COMMUNITY): Payer: Self-pay

## 2024-07-08 ENCOUNTER — Other Ambulatory Visit: Payer: Self-pay

## 2024-07-08 ENCOUNTER — Encounter: Payer: Self-pay | Admitting: Oncology

## 2024-07-08 NOTE — Telephone Encounter (Signed)
 24 hr call back:  Attempted to reach pt to discuss how he is feeling after receiving first time velcade inj yesterday. Pt did not answer. VM left informing pt to call us  if he has any questions.

## 2024-07-08 NOTE — Telephone Encounter (Signed)
 Oral Oncology Patient Advocate Encounter  Prior Authorization for Cyclophosphamide has been approved under the patient's Medicare Part B.    PA# [id number] Effective dates: 07/08/2024 through 10/06/2098.  Patients co-pay is $12.70.    Angelus Hoopes ([name]) [name], CPhT  [facility], [name], [name] - Oral Chemotherapy Patient Advocate Specialist III Phone: [phone]  Fax: [phone]

## 2024-07-09 ENCOUNTER — Other Ambulatory Visit: Payer: Self-pay

## 2024-07-09 ENCOUNTER — Telehealth: Payer: Self-pay

## 2024-07-09 NOTE — Telephone Encounter (Signed)
 Confirmed with patient that appointments for Lab and Infusion on 12/08 have been moved to 09/12/24 at 12 noon patient verbalized agreement.

## 2024-07-13 ENCOUNTER — Inpatient Hospital Stay

## 2024-07-13 VITALS — BP 135/50 | HR 94 | Temp 98.1°F | Resp 14

## 2024-07-13 DIAGNOSIS — Z5112 Encounter for antineoplastic immunotherapy: Secondary | ICD-10-CM | POA: Diagnosis not present

## 2024-07-13 DIAGNOSIS — D472 Monoclonal gammopathy: Secondary | ICD-10-CM

## 2024-07-13 LAB — CMP (CANCER CENTER ONLY)
ALT: 20 U/L (ref 0–44)
AST: 32 U/L (ref 15–41)
Albumin: 3.9 g/dL (ref 3.5–5.0)
Alkaline Phosphatase: 66 U/L (ref 38–126)
Anion gap: 10 (ref 5–15)
BUN: 26 mg/dL — ABNORMAL HIGH (ref 8–23)
CO2: 25 mmol/L (ref 22–32)
Calcium: 11.4 mg/dL — ABNORMAL HIGH (ref 8.9–10.3)
Chloride: 101 mmol/L (ref 98–111)
Creatinine: 2.28 mg/dL — ABNORMAL HIGH (ref 0.61–1.24)
GFR, Estimated: 26 mL/min — ABNORMAL LOW (ref >60)
Glucose, Bld: 212 mg/dL — ABNORMAL HIGH (ref 70–99)
Potassium: 4.7 mmol/L (ref 3.5–5.1)
Sodium: 136 mmol/L (ref 135–145)
Total Bilirubin: 0.8 mg/dL (ref 0.0–1.2)
Total Protein: 7.7 g/dL (ref 6.5–8.1)

## 2024-07-13 LAB — CBC WITH DIFFERENTIAL (CANCER CENTER ONLY)
Abs Immature Granulocytes: 0.01 K/uL (ref 0.00–0.07)
Basophils Absolute: 0 K/uL (ref 0.0–0.1)
Basophils Relative: 0 %
Eosinophils Absolute: 0.1 K/uL (ref 0.0–0.5)
Eosinophils Relative: 2 %
HCT: 26.6 % — ABNORMAL LOW (ref 39.0–52.0)
Hemoglobin: 9.4 g/dL — ABNORMAL LOW (ref 13.0–17.0)
Immature Granulocytes: 0 %
Lymphocytes Relative: 32 %
Lymphs Abs: 1 K/uL (ref 0.7–4.0)
MCH: 38.1 pg — ABNORMAL HIGH (ref 26.0–34.0)
MCHC: 35.3 g/dL (ref 30.0–36.0)
MCV: 107.7 fL — ABNORMAL HIGH (ref 80.0–100.0)
Monocytes Absolute: 0.3 K/uL (ref 0.1–1.0)
Monocytes Relative: 9 %
Neutro Abs: 1.8 K/uL (ref 1.7–7.7)
Neutrophils Relative %: 57 %
Platelet Count: 122 K/uL — ABNORMAL LOW (ref 150–400)
RBC: 2.47 MIL/uL — ABNORMAL LOW (ref 4.22–5.81)
RDW: 11.4 % — ABNORMAL LOW (ref 11.5–15.5)
WBC Count: 3.2 K/uL — ABNORMAL LOW (ref 4.0–10.5)
nRBC: 0 % (ref 0.0–0.2)

## 2024-07-13 MED ORDER — BORTEZOMIB CHEMO SQ INJECTION 3.5 MG (2.5MG/ML)
1.3000 mg/m2 | Freq: Once | INTRAMUSCULAR | Status: AC
  Administered 2024-07-13: 2.25 mg via SUBCUTANEOUS
  Filled 2024-07-13: qty 0.9

## 2024-07-13 MED ORDER — DEXAMETHASONE 4 MG PO TABS
20.0000 mg | ORAL_TABLET | Freq: Once | ORAL | Status: AC
  Administered 2024-07-13: 20 mg via ORAL
  Filled 2024-07-13: qty 5

## 2024-07-13 NOTE — Patient Instructions (Signed)
 [facility] - A DEPT OF [facility]  Discharge Instructions: Thank you for choosing [facility] to provide your oncology and hematology care.   If you have a lab appointment with the Cancer Center, please go directly to the Cancer Center and check in at the registration area.   Wear comfortable clothing and clothing appropriate for easy access to any Portacath or PICC line.   We strive to give you quality time with your provider. You may need to reschedule your appointment if you arrive late (15 or more minutes).  Arriving late affects you and other patients whose appointments are after yours.  Also, if you miss three or more appointments without notifying the office, you may be dismissed from the clinic at the provider's discretion.      For prescription refill requests, have your pharmacy contact our office and allow 72 hours for refills to be completed.    Today you received the following chemotherapy and/or immunotherapy agents: velcade .      To help prevent nausea and vomiting after your treatment, we encourage you to take your nausea medication as directed.  BELOW ARE SYMPTOMS THAT SHOULD BE REPORTED IMMEDIATELY: *FEVER GREATER THAN 100.4 F (38 C) OR HIGHER *CHILLS OR SWEATING *NAUSEA AND VOMITING THAT IS NOT CONTROLLED WITH YOUR NAUSEA MEDICATION *UNUSUAL SHORTNESS OF BREATH *UNUSUAL BRUISING OR BLEEDING *URINARY PROBLEMS (pain or burning when urinating, or frequent urination) *BOWEL PROBLEMS (unusual diarrhea, constipation, pain near the anus) TENDERNESS IN MOUTH AND THROAT WITH OR WITHOUT PRESENCE OF ULCERS (sore throat, sores in mouth, or a toothache) UNUSUAL RASH, SWELLING OR PAIN  UNUSUAL VAGINAL DISCHARGE OR ITCHING   Items with * indicate a potential emergency and should be followed up as soon as possible or go to the Emergency Department if any problems should occur.  Please show the CHEMOTHERAPY ALERT CARD or IMMUNOTHERAPY  ALERT CARD at check-in to the Emergency Department and triage nurse.  Should you have questions after your visit or need to cancel or reschedule your appointment, please contact [facility]  Dept: [phone]  and follow the prompts.  Office hours are 8:00 a.m. to 4:30 p.m. Monday - Friday. Please note that voicemails left after 4:00 p.m. may not be returned until the following business day.  We are closed weekends and major holidays. You have access to a nurse at all times for urgent questions. Please call the main number to the clinic Dept: [phone] and follow the prompts.   For any non-urgent questions, you may also contact your provider using MyChart. We now offer e-Visits for anyone 27 and older to request care online for non-urgent symptoms. For details visit [email or web address].   Also download the MyChart app! Go to the app store, search MyChart, open the app, select [facility], and log in with your MyChart username and password.

## 2024-07-14 ENCOUNTER — Other Ambulatory Visit

## 2024-07-14 ENCOUNTER — Ambulatory Visit

## 2024-07-21 ENCOUNTER — Inpatient Hospital Stay (HOSPITAL_BASED_OUTPATIENT_CLINIC_OR_DEPARTMENT_OTHER): Admitting: Nurse Practitioner

## 2024-07-21 ENCOUNTER — Other Ambulatory Visit

## 2024-07-21 ENCOUNTER — Encounter: Payer: Self-pay | Admitting: Nurse Practitioner

## 2024-07-21 ENCOUNTER — Inpatient Hospital Stay

## 2024-07-21 VITALS — BP 117/78 | HR 74 | Temp 98.0°F | Resp 18 | Ht 68.0 in | Wt 127.8 lb

## 2024-07-21 VITALS — BP 130/62 | HR 76 | Temp 98.2°F | Resp 18

## 2024-07-21 DIAGNOSIS — D472 Monoclonal gammopathy: Secondary | ICD-10-CM

## 2024-07-21 DIAGNOSIS — Z5112 Encounter for antineoplastic immunotherapy: Secondary | ICD-10-CM | POA: Diagnosis not present

## 2024-07-21 LAB — CBC WITH DIFFERENTIAL (CANCER CENTER ONLY)
Abs Immature Granulocytes: 0.01 K/uL (ref 0.00–0.07)
Basophils Absolute: 0 K/uL (ref 0.0–0.1)
Basophils Relative: 0 %
Eosinophils Absolute: 0 K/uL (ref 0.0–0.5)
Eosinophils Relative: 1 %
HCT: 24 % — ABNORMAL LOW (ref 39.0–52.0)
Hemoglobin: 8.5 g/dL — ABNORMAL LOW (ref 13.0–17.0)
Immature Granulocytes: 0 %
Lymphocytes Relative: 29 %
Lymphs Abs: 0.8 K/uL (ref 0.7–4.0)
MCH: 38.3 pg — ABNORMAL HIGH (ref 26.0–34.0)
MCHC: 35.4 g/dL (ref 30.0–36.0)
MCV: 108.1 fL — ABNORMAL HIGH (ref 80.0–100.0)
Monocytes Absolute: 0.3 K/uL (ref 0.1–1.0)
Monocytes Relative: 9 %
Neutro Abs: 1.7 K/uL (ref 1.7–7.7)
Neutrophils Relative %: 61 %
Platelet Count: 111 K/uL — ABNORMAL LOW (ref 150–400)
RBC: 2.22 MIL/uL — ABNORMAL LOW (ref 4.22–5.81)
RDW: 11.9 % (ref 11.5–15.5)
WBC Count: 2.8 K/uL — ABNORMAL LOW (ref 4.0–10.5)
nRBC: 0 % (ref 0.0–0.2)

## 2024-07-21 LAB — CMP (CANCER CENTER ONLY)
ALT: 17 U/L (ref 0–44)
AST: 26 U/L (ref 15–41)
Albumin: 3.8 g/dL (ref 3.5–5.0)
Alkaline Phosphatase: 61 U/L (ref 38–126)
Anion gap: 8 (ref 5–15)
BUN: 31 mg/dL — ABNORMAL HIGH (ref 8–23)
CO2: 26 mmol/L (ref 22–32)
Calcium: 11.2 mg/dL — ABNORMAL HIGH (ref 8.9–10.3)
Chloride: 104 mmol/L (ref 98–111)
Creatinine: 2.37 mg/dL — ABNORMAL HIGH (ref 0.61–1.24)
GFR, Estimated: 25 mL/min — ABNORMAL LOW (ref >60)
Glucose, Bld: 203 mg/dL — ABNORMAL HIGH (ref 70–99)
Potassium: 4.6 mmol/L (ref 3.5–5.1)
Sodium: 138 mmol/L (ref 135–145)
Total Bilirubin: 0.7 mg/dL (ref 0.0–1.2)
Total Protein: 7.3 g/dL (ref 6.5–8.1)

## 2024-07-21 MED ORDER — DEXAMETHASONE 4 MG PO TABS
20.0000 mg | ORAL_TABLET | Freq: Once | ORAL | Status: AC
  Administered 2024-07-21: 20 mg via ORAL
  Filled 2024-07-21: qty 5

## 2024-07-21 MED ORDER — BORTEZOMIB CHEMO SQ INJECTION 3.5 MG (2.5MG/ML)
1.3000 mg/m2 | Freq: Once | INTRAMUSCULAR | Status: AC
  Administered 2024-07-21: 2.25 mg via SUBCUTANEOUS
  Filled 2024-07-21: qty 0.9

## 2024-07-21 NOTE — Progress Notes (Signed)
 Patient seen by Olam Ned NP today  Vitals are within treatment parameters:Yes   Labs are within treatment parameters: Yes WBC 2.8 Creatinine 2.37 its okay to proceed.  Treatment plan has been signed: Yes   Per physician team, Patient is ready for treatment and there are NO modifications to the treatment plan.

## 2024-07-21 NOTE — Patient Instructions (Signed)
 [facility] - A DEPT OF [facility]  Discharge Instructions: Thank you for choosing [facility] to provide your oncology and hematology care.   If you have a lab appointment with the Cancer Center, please go directly to the Cancer Center and check in at the registration area.   Wear comfortable clothing and clothing appropriate for easy access to any Portacath or PICC line.   We strive to give you quality time with your provider. You may need to reschedule your appointment if you arrive late (15 or more minutes).  Arriving late affects you and other patients whose appointments are after yours.  Also, if you miss three or more appointments without notifying the office, you may be dismissed from the clinic at the provider's discretion.      For prescription refill requests, have your pharmacy contact our office and allow 72 hours for refills to be completed.    Today you received the following chemotherapy and/or immunotherapy agents: velcade .      To help prevent nausea and vomiting after your treatment, we encourage you to take your nausea medication as directed.  BELOW ARE SYMPTOMS THAT SHOULD BE REPORTED IMMEDIATELY: *FEVER GREATER THAN 100.4 F (38 C) OR HIGHER *CHILLS OR SWEATING *NAUSEA AND VOMITING THAT IS NOT CONTROLLED WITH YOUR NAUSEA MEDICATION *UNUSUAL SHORTNESS OF BREATH *UNUSUAL BRUISING OR BLEEDING *URINARY PROBLEMS (pain or burning when urinating, or frequent urination) *BOWEL PROBLEMS (unusual diarrhea, constipation, pain near the anus) TENDERNESS IN MOUTH AND THROAT WITH OR WITHOUT PRESENCE OF ULCERS (sore throat, sores in mouth, or a toothache) UNUSUAL RASH, SWELLING OR PAIN  UNUSUAL VAGINAL DISCHARGE OR ITCHING   Items with * indicate a potential emergency and should be followed up as soon as possible or go to the Emergency Department if any problems should occur.  Please show the CHEMOTHERAPY ALERT CARD or IMMUNOTHERAPY  ALERT CARD at check-in to the Emergency Department and triage nurse.  Should you have questions after your visit or need to cancel or reschedule your appointment, please contact [facility]  Dept: [phone]  and follow the prompts.  Office hours are 8:00 a.m. to 4:30 p.m. Monday - Friday. Please note that voicemails left after 4:00 p.m. may not be returned until the following business day.  We are closed weekends and major holidays. You have access to a nurse at all times for urgent questions. Please call the main number to the clinic Dept: [phone] and follow the prompts.   For any non-urgent questions, you may also contact your provider using MyChart. We now offer e-Visits for anyone 27 and older to request care online for non-urgent symptoms. For details visit [email or web address].   Also download the MyChart app! Go to the app store, search MyChart, open the app, select [facility], and log in with your MyChart username and password.

## 2024-07-21 NOTE — Progress Notes (Signed)
  [facility] OFFICE PROGRESS NOTE   Diagnosis: Smoldering myeloma  INTERVAL HISTORY:   Rodney Elliott returns as scheduled.  He began cycle 1 CyBorD 07/07/2024, day 8 treatment 07/13/2024.  He denies nausea/vomiting.  No mouth sores.  No diarrhea.  He occasionally belches.  No numbness or tingling in the hands or feet.  No rash.  He denies pain.  He has a good appetite.  He notes morning time fatigue which improves as the day progresses.  Objective:  Vital signs in last 24 hours:  Blood pressure 117/78, pulse 74, temperature 98 F (36.7 C), resp. rate 18, height 5' 8 (1.727 m), weight 127 lb 12.8 oz (58 kg), SpO2 100%.    HEENT: No thrush or ulcers. Resp: Lungs clear bilaterally. Cardio: Regular rate and rhythm. GI: No hepatosplenomegaly.  Nontender. Vascular: No leg edema.   Lab Results:  Lab Results  Component Value Date   WBC 2.8 (L) 07/21/2024   HGB 8.5 (L) 07/21/2024   HCT 24.0 (L) 07/21/2024   MCV 108.1 (H) 07/21/2024   PLT 111 (L) 07/21/2024   NEUTROABS 1.7 07/21/2024    Imaging:  No results found.  Medications: I have reviewed the patient's current medications.  Assessment/Plan: Macrocytic anemia   2. Renal insufficiency   3.   Serum monoclonal IgG kappa protein Bone survey 12/29/2020-subtle lytic lesions in the calvarium and proximal left femur Progressive elevation of free kappa light chains and increased kappa light chain proteinuria March 2022 Bone marrow biopsy 02/12/2021- 5- 7% plasma cells, kappa light chain restricted, 46 X,-Y karyotype Bone marrow biopsy 04/15/2024: Hypercellular marrow with 18% plasma cells by manual differential, 30% by CD138, kappa restricted, negative myeloma FISH panel Bone survey 04/27/2024-questionable single new small lucent lesion within the proximal left humerus.  Small lytic lesions in the proximal left femur and calvarium unchanged from 2022. 06/18/2024-IgG, kappa free light chains, M spike with progressive  rise Acyclovir  prophylaxis  07/07/2024 cycle 1 day 1 velcade/Dex, cytoxan 07/08/2024; day 8 07/13/2024; day 15 07/21/2024   4.    Hypertension   5.    Idiopathic nonimmune hemolytic anemia/thrombocytopenia in 2014, spontaneously resolved   6.   Mild hypercalcemia  Disposition: Rodney Elliott appears stable.  He is completing cycle 1 CyBorD.  He is tolerating treatment well.  Plan to proceed with day 15 today as scheduled.  CBC and chemistry panel reviewed.  Labs adequate for treatment.  He will return for follow-up and cycle 2-day 1 CyBorD in 2 weeks.  We are available to see him sooner if needed.    Rodney Elliott ANP/GNP-BC   07/21/2024  1:27 PM

## 2024-07-23 ENCOUNTER — Other Ambulatory Visit: Payer: Self-pay

## 2024-07-25 ENCOUNTER — Other Ambulatory Visit: Payer: Self-pay

## 2024-07-27 ENCOUNTER — Other Ambulatory Visit: Payer: Self-pay

## 2024-07-27 ENCOUNTER — Telehealth: Payer: Self-pay | Admitting: Nurse Practitioner

## 2024-07-27 ENCOUNTER — Other Ambulatory Visit (HOSPITAL_COMMUNITY): Payer: Self-pay

## 2024-07-27 ENCOUNTER — Other Ambulatory Visit: Payer: Self-pay | Admitting: Oncology

## 2024-07-27 DIAGNOSIS — D472 Monoclonal gammopathy: Secondary | ICD-10-CM

## 2024-07-27 MED ORDER — CYCLOPHOSPHAMIDE 50 MG PO CAPS
175.0000 mg/m2 | ORAL_CAPSULE | ORAL | 0 refills | Status: DC
  Filled 2024-07-27: qty 18, 28d supply, fill #0

## 2024-07-27 MED ORDER — ONDANSETRON HCL 4 MG PO TABS
4.0000 mg | ORAL_TABLET | Freq: Three times a day (TID) | ORAL | 1 refills | Status: AC | PRN
  Filled 2024-07-27: qty 30, 10d supply, fill #0
  Filled 2024-08-27: qty 30, 10d supply, fill #1

## 2024-07-27 NOTE — Progress Notes (Signed)
 Specialty Pharmacy Ongoing Clinical Assessment Note  Rodney Elliott is a 89 y.o. male who is being followed by the specialty pharmacy service for RxSp Oncology   Patient's specialty medication(s) reviewed today: cycloPHOSphamide (CYTOXAN)   Missed doses in the last 4 weeks: 0   Patient/Caregiver did not have any additional questions or concerns.   Therapeutic benefit summary: Unable to assess   Adverse events/side effects summary: Experienced adverse events/side effects (fatigue and occasional nausea (compazine/zofran prn helps))   Patient's therapy is appropriate to: Continue    Goals Addressed             This Visit's Progress    Achieve or maintain remission   No change    Patient is initiating therapy. Patient will maintain adherence         Follow up: 3 months  Silvano [name] Specialty Pharmacist

## 2024-07-27 NOTE — Telephone Encounter (Signed)
 PT called to confirm appointments

## 2024-07-27 NOTE — Progress Notes (Signed)
 Specialty Pharmacy Refill Coordination Note  Rodney Elliott is a 89 y.o. male contacted today regarding refills of specialty medication(s) cycloPHOSphamide BUEL)   Patient requested Pickup at [facility] [place] date: 08/03/24   Medication will be filled on 08/02/24. This fill date is pending response to refill request from provider. Patient is aware and if they have not received fill by intended date they must follow up with pharmacy.

## 2024-07-28 ENCOUNTER — Other Ambulatory Visit

## 2024-07-28 ENCOUNTER — Ambulatory Visit: Admitting: Oncology

## 2024-07-30 ENCOUNTER — Other Ambulatory Visit: Payer: Self-pay | Admitting: Oncology

## 2024-08-02 ENCOUNTER — Other Ambulatory Visit: Payer: Self-pay

## 2024-08-05 ENCOUNTER — Inpatient Hospital Stay (HOSPITAL_BASED_OUTPATIENT_CLINIC_OR_DEPARTMENT_OTHER): Admitting: Nurse Practitioner

## 2024-08-05 ENCOUNTER — Encounter: Payer: Self-pay | Admitting: Nurse Practitioner

## 2024-08-05 ENCOUNTER — Inpatient Hospital Stay

## 2024-08-05 VITALS — BP 139/82 | HR 89 | Temp 98.2°F | Resp 18 | Ht 68.0 in | Wt 128.8 lb

## 2024-08-05 DIAGNOSIS — D472 Monoclonal gammopathy: Secondary | ICD-10-CM

## 2024-08-05 DIAGNOSIS — Z5112 Encounter for antineoplastic immunotherapy: Secondary | ICD-10-CM | POA: Diagnosis not present

## 2024-08-05 LAB — CMP (CANCER CENTER ONLY)
ALT: 21 U/L (ref 0–44)
AST: 32 U/L (ref 15–41)
Albumin: 3.9 g/dL (ref 3.5–5.0)
Alkaline Phosphatase: 68 U/L (ref 38–126)
Anion gap: 6 (ref 5–15)
BUN: 29 mg/dL — ABNORMAL HIGH (ref 8–23)
CO2: 27 mmol/L (ref 22–32)
Calcium: 11 mg/dL — ABNORMAL HIGH (ref 8.9–10.3)
Chloride: 108 mmol/L (ref 98–111)
Creatinine: 2.29 mg/dL — ABNORMAL HIGH (ref 0.61–1.24)
GFR, Estimated: 26 mL/min — ABNORMAL LOW (ref >60)
Glucose, Bld: 155 mg/dL — ABNORMAL HIGH (ref 70–99)
Potassium: 4.5 mmol/L (ref 3.5–5.1)
Sodium: 141 mmol/L (ref 135–145)
Total Bilirubin: 0.7 mg/dL (ref 0.0–1.2)
Total Protein: 8.2 g/dL — ABNORMAL HIGH (ref 6.5–8.1)

## 2024-08-05 LAB — CBC WITH DIFFERENTIAL (CANCER CENTER ONLY)
Abs Immature Granulocytes: 0 K/uL (ref 0.00–0.07)
Basophils Absolute: 0 K/uL (ref 0.0–0.1)
Basophils Relative: 0 %
Eosinophils Absolute: 0.1 K/uL (ref 0.0–0.5)
Eosinophils Relative: 3 %
HCT: 27.2 % — ABNORMAL LOW (ref 39.0–52.0)
Hemoglobin: 9.4 g/dL — ABNORMAL LOW (ref 13.0–17.0)
Immature Granulocytes: 0 %
Lymphocytes Relative: 22 %
Lymphs Abs: 0.6 K/uL — ABNORMAL LOW (ref 0.7–4.0)
MCH: 38.7 pg — ABNORMAL HIGH (ref 26.0–34.0)
MCHC: 34.6 g/dL (ref 30.0–36.0)
MCV: 111.9 fL — ABNORMAL HIGH (ref 80.0–100.0)
Monocytes Absolute: 0.2 K/uL (ref 0.1–1.0)
Monocytes Relative: 8 %
Neutro Abs: 1.7 K/uL (ref 1.7–7.7)
Neutrophils Relative %: 67 %
Platelet Count: 128 K/uL — ABNORMAL LOW (ref 150–400)
RBC: 2.43 MIL/uL — ABNORMAL LOW (ref 4.22–5.81)
RDW: 12.9 % (ref 11.5–15.5)
WBC Count: 2.6 K/uL — ABNORMAL LOW (ref 4.0–10.5)
nRBC: 0 % (ref 0.0–0.2)

## 2024-08-05 MED ORDER — BORTEZOMIB CHEMO SQ INJECTION 3.5 MG (2.5MG/ML)
1.3000 mg/m2 | Freq: Once | INTRAMUSCULAR | Status: AC
  Administered 2024-08-05: 2.25 mg via SUBCUTANEOUS
  Filled 2024-08-05: qty 0.9

## 2024-08-05 MED ORDER — DEXAMETHASONE 4 MG PO TABS
20.0000 mg | ORAL_TABLET | Freq: Once | ORAL | Status: AC
  Administered 2024-08-05: 20 mg via ORAL
  Filled 2024-08-05: qty 5

## 2024-08-05 NOTE — Progress Notes (Signed)
  [facility] OFFICE PROGRESS NOTE   Diagnosis: Smoldering myeloma  INTERVAL HISTORY:   [name] returns as scheduled.  He has completed 1 cycle of CyBorD.  He tolerated well.  He denies nausea/vomiting.  No mouth sores.  No diarrhea.  No rash.  No numbness or tingling in the hands or feet.  He denies pain.  He has a good appetite.  He intermittently feels a little weak.  No change from baseline.  Objective:  Vital signs in last 24 hours:  Blood pressure 139/82, pulse 89, temperature 98.2 F (36.8 C), temperature source Temporal, resp. rate 18, height 5' 8 (1.727 m), weight 128 lb 12.8 oz (58.4 kg), SpO2 100%.    HEENT: No thrush or ulcers. Resp: Lungs clear bilaterally. Cardio: Regular rate and rhythm. GI: No hepatosplenomegaly.  Nontender. Vascular: No leg edema.   Lab Results:  Lab Results  Component Value Date   WBC 2.8 (L) 07/21/2024   HGB 8.5 (L) 07/21/2024   HCT 24.0 (L) 07/21/2024   MCV 108.1 (H) 07/21/2024   PLT 111 (L) 07/21/2024   NEUTROABS 1.7 07/21/2024    Imaging:  No results found.  Medications: I have reviewed the patient's current medications.  Assessment/Plan: Macrocytic anemia   2. Renal insufficiency   3.   Serum monoclonal IgG kappa protein Bone survey 12/29/2020-subtle lytic lesions in the calvarium and proximal left femur Progressive elevation of free kappa light chains and increased kappa light chain proteinuria March 2022 Bone marrow biopsy 02/12/2021- 5- 7% plasma cells, kappa light chain restricted, 46 X,-Y karyotype Bone marrow biopsy 04/15/2024: Hypercellular marrow with 18% plasma cells by manual differential, 30% by CD138, kappa restricted, negative myeloma FISH panel Bone survey 04/27/2024-questionable single new small lucent lesion within the proximal left humerus.  Small lytic lesions in the proximal left femur and calvarium unchanged from 2022. 06/18/2024-IgG, kappa free light chains, M spike with progressive  rise Acyclovir  prophylaxis  Cycle 1 day 1 CyBorD 07/07/2024 (Cytoxan 07/08/2024); day 8 07/13/2024; day 15 07/21/2024 Cycle 2 CyBorD 08/05/2024   4.    Hypertension   5.    Idiopathic nonimmune hemolytic anemia/thrombocytopenia in 2014, spontaneously resolved   6.   Mild hypercalcemia    Disposition: [name] appears stable.  He has completed 1 cycle of CyBorD.  He tolerated well.  Plan to proceed with another cycle today.  CBC and chemistry panel reviewed.  Labs adequate for treatment.  He will return for follow-up and day 8 in 1 week.  We are available to see him sooner if needed.  Patient seen with Dr. Cloretta.    Rodney Elliott ANP/GNP-BC   08/05/2024  9:09 AM This was a shared visit with Rodney Elliott.  [name] has completed 1 cycle of CyBorD.  He is tolerating the treatment well.  We will follow-up on the myeloma panel from today.  The plan is to proceed with cycle 2 CyBorD today.  He is stable from a hematologic standpoint and the creatinine has not changed significantly over the past month.  Arvella Cloretta, MD

## 2024-08-05 NOTE — Patient Instructions (Signed)
 Bortezomib  Injection What is this medication? BORTEZOMIB  (bor TEZ oh mib) treats lymphoma. It may also be used to treat multiple myeloma, a type of bone marrow cancer. It works by blocking a protein that causes cancer cells to grow and multiply. This helps to slow or stop the spread of cancer cells. This medicine may be used for other purposes; ask your health care provider or pharmacist if you have questions. COMMON BRAND NAME(S): BORUZU , Velcade  What should I tell my care team before I take this medication? They need to know if you have any of these conditions: Dehydration Diabetes Heart disease Liver disease Tingling of the fingers or toes or other nerve disorder An unusual or allergic reaction to bortezomib , other medications, foods, dyes, or preservatives If you or your partner are pregnant or trying to get pregnant Breastfeeding How should I use this medication? This medication is injected into a vein or under the skin. It is given by your care team in a hospital or clinic setting. Talk to your care team about the use of this medication in children. Special care may be needed. Overdosage: If you think you have taken too much of this medicine contact a poison control center or emergency room at once. NOTE: This medicine is only for you. Do not share this medicine with others. What if I miss a dose? Keep appointments for follow-up doses. It is important not to miss your dose. Call your care team if you are unable to keep an appointment. What may interact with this medication? Ketoconazole Rifampin This list may not describe all possible interactions. Give your health care provider a list of all the medicines, herbs, non-prescription drugs, or dietary supplements you use. Also tell them if you smoke, drink alcohol, or use illegal drugs. Some items may interact with your medicine. What should I watch for while using this medication? Your condition will be monitored carefully while you  are receiving this medication. You may need blood work while taking this medication. This medication may affect your coordination, reaction time, or judgment. Do not drive or operate machinery until you know how this medication affects you. Sit up or stand slowly to reduce the risk of dizzy or fainting spells. Drinking alcohol with this medication can increase the risk of these side effects. This medication may increase your risk of getting an infection. Call your care team for advice if you get a fever, chills, sore throat, or other symptoms of a cold or flu. Do not treat yourself. Try to avoid being around people who are sick. Check with your care team if you have severe diarrhea, nausea, and vomiting, or if you sweat a lot. The loss of too much body fluid may make it dangerous for you to take this medication. Talk to your care team if you may be pregnant. Serious birth defects can occur if you take this medication during pregnancy and for 7 months after the last dose. You will need a negative pregnancy test before starting this medication. Contraception is recommended while taking this medication and for 7 months after the last dose. Your care team can help you find the option that works for you. If your partner can get pregnant, use a condom during sex while taking this medication and for 4 months after the last dose. Do not breastfeed while taking this medication and for 2 months after the last dose. This medication may cause infertility. Talk to your care team if you are concerned about your fertility. What side  effects may I notice from receiving this medication? Side effects that you should report to your care team as soon as possible: Allergic reactions--skin rash, itching, hives, swelling of the face, lips, tongue, or throat Bleeding--bloody or black, tar-like stools, vomiting blood or brown material that looks like coffee grounds, red or dark brown urine, small red or purple spots on skin,  unusual bruising or bleeding Bleeding in the brain--severe headache, stiff neck, confusion, dizziness, change in vision, numbness or weakness of the face, arm, or leg, trouble speaking, trouble walking, vomiting Bowel blockage--stomach cramping, unable to have a bowel movement or pass gas, loss of appetite, vomiting Heart failure--shortness of breath, swelling of the ankles, feet, or hands, sudden weight gain, unusual weakness or fatigue Infection--fever, chills, cough, sore throat, wounds that don't heal, pain or trouble when passing urine, general feeling of discomfort or being unwell Liver injury--right upper belly pain, loss of appetite, nausea, light-colored stool, dark yellow or brown urine, yellowing skin or eyes, unusual weakness or fatigue Low blood pressure--dizziness, feeling faint or lightheaded, blurry vision Lung injury--shortness of breath or trouble breathing, cough, spitting up blood, chest pain, fever Pain, tingling, or numbness in the hands or feet Severe or prolonged diarrhea Stomach pain, bloody diarrhea, pale skin, unusual weakness or fatigue, decrease in the amount of urine, which may be signs of hemolytic uremic syndrome Sudden and severe headache, confusion, change in vision, seizures, which may be signs of posterior reversible encephalopathy syndrome (PRES) TTP--purple spots on the skin or inside the mouth, pale skin, yellowing skin or eyes, unusual weakness or fatigue, fever, fast or irregular heartbeat, confusion, change in vision, trouble speaking, trouble walking Tumor lysis syndrome (TLS)--nausea, vomiting, diarrhea, decrease in the amount of urine, dark urine, unusual weakness or fatigue, confusion, muscle pain or cramps, fast or irregular heartbeat, joint pain Side effects that usually do not require medical attention (report to your care team if they continue or are bothersome): Constipation Diarrhea Fatigue Loss of appetite Nausea This list may not describe all  possible side effects. Call your doctor for medical advice about side effects. You may report side effects to FDA at 1-800-FDA-1088. Where should I keep my medication? This medication is given in a hospital or clinic. It will not be stored at home. NOTE: This sheet is a summary. It may not cover all possible information. If you have questions about this medicine, talk to your doctor, pharmacist, or health care provider.  2024 Elsevier/Gold Standard (2022-02-26 00:00:00)

## 2024-08-05 NOTE — Progress Notes (Signed)
 Patient seen by Olam Ned NP today  Vitals are within treatment parameters:Yes   Labs are within treatment parameters: Yes CREATININE 2.29 WBC 2.6. It;s ok to proceed  Treatment plan has been signed: Yes   Per physician team, Patient is ready for treatment and there are NO modifications to the treatment plan.

## 2024-08-06 ENCOUNTER — Other Ambulatory Visit: Payer: Self-pay

## 2024-08-06 LAB — IGG: IgG (Immunoglobin G), Serum: 2779 mg/dL — ABNORMAL HIGH (ref 603–1613)

## 2024-08-06 LAB — KAPPA/LAMBDA LIGHT CHAINS
Kappa free light chain: 3906.1 mg/L — ABNORMAL HIGH (ref 3.3–19.4)
Kappa, lambda light chain ratio: 333.85 — ABNORMAL HIGH (ref 0.26–1.65)
Lambda free light chains: 11.7 mg/L (ref 5.7–26.3)

## 2024-08-07 ENCOUNTER — Other Ambulatory Visit: Payer: Self-pay

## 2024-08-10 LAB — PROTEIN ELECTROPHORESIS, SERUM
A/G Ratio: 0.9 (ref 0.7–1.7)
Albumin ELP: 3.5 g/dL (ref 2.9–4.4)
Alpha-1-Globulin: 0.3 g/dL (ref 0.0–0.4)
Alpha-2-Globulin: 0.6 g/dL (ref 0.4–1.0)
Beta Globulin: 1 g/dL (ref 0.7–1.3)
Gamma Globulin: 2.3 g/dL — ABNORMAL HIGH (ref 0.4–1.8)
Globulin, Total: 4.1 g/dL — ABNORMAL HIGH (ref 2.2–3.9)
M-Spike, %: 2.1 g/dL — ABNORMAL HIGH
Total Protein ELP: 7.6 g/dL (ref 6.0–8.5)

## 2024-08-12 ENCOUNTER — Inpatient Hospital Stay: Admitting: Nurse Practitioner

## 2024-08-12 ENCOUNTER — Inpatient Hospital Stay

## 2024-08-12 ENCOUNTER — Inpatient Hospital Stay (HOSPITAL_BASED_OUTPATIENT_CLINIC_OR_DEPARTMENT_OTHER): Admitting: Nurse Practitioner

## 2024-08-12 ENCOUNTER — Encounter: Payer: Self-pay | Admitting: Nurse Practitioner

## 2024-08-12 ENCOUNTER — Inpatient Hospital Stay: Attending: Oncology

## 2024-08-12 VITALS — BP 132/81 | HR 88 | Temp 98.0°F | Resp 18 | Ht 68.0 in | Wt 128.9 lb

## 2024-08-12 DIAGNOSIS — Z9089 Acquired absence of other organs: Secondary | ICD-10-CM | POA: Insufficient documentation

## 2024-08-12 DIAGNOSIS — D539 Nutritional anemia, unspecified: Secondary | ICD-10-CM | POA: Insufficient documentation

## 2024-08-12 DIAGNOSIS — D472 Monoclonal gammopathy: Secondary | ICD-10-CM | POA: Insufficient documentation

## 2024-08-12 DIAGNOSIS — Z860101 Personal history of adenomatous and serrated colon polyps: Secondary | ICD-10-CM | POA: Diagnosis not present

## 2024-08-12 DIAGNOSIS — K5900 Constipation, unspecified: Secondary | ICD-10-CM | POA: Diagnosis not present

## 2024-08-12 DIAGNOSIS — Z79899 Other long term (current) drug therapy: Secondary | ICD-10-CM | POA: Insufficient documentation

## 2024-08-12 DIAGNOSIS — D6959 Other secondary thrombocytopenia: Secondary | ICD-10-CM | POA: Insufficient documentation

## 2024-08-12 DIAGNOSIS — Z5112 Encounter for antineoplastic immunotherapy: Secondary | ICD-10-CM | POA: Insufficient documentation

## 2024-08-12 DIAGNOSIS — N289 Disorder of kidney and ureter, unspecified: Secondary | ICD-10-CM | POA: Diagnosis not present

## 2024-08-12 DIAGNOSIS — I10 Essential (primary) hypertension: Secondary | ICD-10-CM | POA: Insufficient documentation

## 2024-08-12 DIAGNOSIS — Z79624 Long term (current) use of inhibitors of nucleotide synthesis: Secondary | ICD-10-CM | POA: Insufficient documentation

## 2024-08-12 LAB — CBC WITH DIFFERENTIAL (CANCER CENTER ONLY)
Abs Immature Granulocytes: 0.01 K/uL (ref 0.00–0.07)
Basophils Absolute: 0 K/uL (ref 0.0–0.1)
Basophils Relative: 0 %
Eosinophils Absolute: 0.1 K/uL (ref 0.0–0.5)
Eosinophils Relative: 2 %
HCT: 26.3 % — ABNORMAL LOW (ref 39.0–52.0)
Hemoglobin: 9.3 g/dL — ABNORMAL LOW (ref 13.0–17.0)
Immature Granulocytes: 0 %
Lymphocytes Relative: 24 %
Lymphs Abs: 0.6 K/uL — ABNORMAL LOW (ref 0.7–4.0)
MCH: 38.6 pg — ABNORMAL HIGH (ref 26.0–34.0)
MCHC: 35.4 g/dL (ref 30.0–36.0)
MCV: 109.1 fL — ABNORMAL HIGH (ref 80.0–100.0)
Monocytes Absolute: 0.3 K/uL (ref 0.1–1.0)
Monocytes Relative: 12 %
Neutro Abs: 1.6 K/uL — ABNORMAL LOW (ref 1.7–7.7)
Neutrophils Relative %: 62 %
Platelet Count: 87 K/uL — ABNORMAL LOW (ref 150–400)
RBC: 2.41 MIL/uL — ABNORMAL LOW (ref 4.22–5.81)
RDW: 12.4 % (ref 11.5–15.5)
WBC Count: 2.6 K/uL — ABNORMAL LOW (ref 4.0–10.5)
nRBC: 0 % (ref 0.0–0.2)

## 2024-08-12 LAB — CMP (CANCER CENTER ONLY)
ALT: 19 U/L (ref 0–44)
AST: 29 U/L (ref 15–41)
Albumin: 4 g/dL (ref 3.5–5.0)
Alkaline Phosphatase: 68 U/L (ref 38–126)
Anion gap: 7 (ref 5–15)
BUN: 33 mg/dL — ABNORMAL HIGH (ref 8–23)
CO2: 25 mmol/L (ref 22–32)
Calcium: 11.1 mg/dL — ABNORMAL HIGH (ref 8.9–10.3)
Chloride: 105 mmol/L (ref 98–111)
Creatinine: 2.33 mg/dL — ABNORMAL HIGH (ref 0.61–1.24)
GFR, Estimated: 25 mL/min — ABNORMAL LOW (ref >60)
Glucose, Bld: 145 mg/dL — ABNORMAL HIGH (ref 70–99)
Potassium: 4.5 mmol/L (ref 3.5–5.1)
Sodium: 138 mmol/L (ref 135–145)
Total Bilirubin: 0.8 mg/dL (ref 0.0–1.2)
Total Protein: 8.2 g/dL — ABNORMAL HIGH (ref 6.5–8.1)

## 2024-08-12 MED ORDER — BORTEZOMIB CHEMO SQ INJECTION 3.5 MG (2.5MG/ML)
1.3000 mg/m2 | Freq: Once | INTRAMUSCULAR | Status: AC
  Administered 2024-08-12: 2.25 mg via SUBCUTANEOUS
  Filled 2024-08-12: qty 0.9

## 2024-08-12 MED ORDER — DEXAMETHASONE 4 MG PO TABS
20.0000 mg | ORAL_TABLET | Freq: Once | ORAL | Status: AC
  Administered 2024-08-12: 20 mg via ORAL
  Filled 2024-08-12: qty 5

## 2024-08-12 NOTE — Progress Notes (Signed)
 Patient seen by Olam Ned NP today  Vitals are within treatment parameters:Yes   Labs are within treatment parameters: No (Please specify and give further instructions.) creatinine 2.33 Plts 87 its okay to proceed.  Treatment plan has been signed: Yes   Per physician team, Patient is ready for treatment and there are NO modifications to the treatment plan.

## 2024-08-12 NOTE — Progress Notes (Signed)
  [facility] OFFICE PROGRESS NOTE   Diagnosis: Smoldering myeloma  INTERVAL HISTORY:   Rodney Elliott returns as scheduled.  He began cycle 2 CyBorD 08/05/2024.  He denies nausea/vomiting.  No mouth sores.  No diarrhea.  No numbness or tingling in the hands or feet.  No rash.  No bleeding.  Objective:  Vital signs in last 24 hours:  Blood pressure 132/81, pulse 88, temperature 98 F (36.7 C), temperature source Temporal, resp. rate 18, height 5' 8 (1.727 m), weight 128 lb 14.4 oz (58.5 kg), SpO2 100%.    HEENT: No thrush or ulcers. Resp: Lungs clear bilaterally. Cardio: Regular rate and rhythm. GI: Abdomen soft and nontender.  No hepatosplenomegaly. Vascular: No leg edema. Skin: No rash.   Lab Results:  Lab Results  Component Value Date   WBC 2.6 (L) 08/12/2024   HGB 9.3 (L) 08/12/2024   HCT 26.3 (L) 08/12/2024   MCV 109.1 (H) 08/12/2024   PLT 87 (L) 08/12/2024   NEUTROABS 1.6 (L) 08/12/2024    Imaging:  No results found.  Medications: I have reviewed the patient's current medications.  Assessment/Plan: Macrocytic anemia   2. Renal insufficiency   3.   Serum monoclonal IgG kappa protein Bone survey 12/29/2020-subtle lytic lesions in the calvarium and proximal left femur Progressive elevation of free kappa light chains and increased kappa light chain proteinuria March 2022 Bone marrow biopsy 02/12/2021- 5- 7% plasma cells, kappa light chain restricted, 46 X,-Y karyotype Bone marrow biopsy 04/15/2024: Hypercellular marrow with 18% plasma cells by manual differential, 30% by CD138, kappa restricted, negative myeloma FISH panel Bone survey 04/27/2024-questionable single new small lucent lesion within the proximal left humerus.  Small lytic lesions in the proximal left femur and calvarium unchanged from 2022. 06/18/2024-IgG, kappa free light chains, M spike with progressive rise Acyclovir  prophylaxis  Cycle 1 day 1 CyBorD 07/07/2024 (Cytoxan 07/08/2024); day 8  07/13/2024; day 15 07/21/2024 Cycle 2 CyBorD 08/05/2024, day 8 08/12/2024 08/05/2024 M spike, kappa free light chains, IgG increased   4.    Hypertension   5.    Idiopathic nonimmune hemolytic anemia/thrombocytopenia in 2014, spontaneously resolved   6.   Mild hypercalcemia    Disposition: Rodney Elliott appears stable.  He is completing cycle 2 CyBorD.  He is tolerating treatment well.  Plan to proceed with day 8 today as scheduled.  He has mild thrombocytopenia.  He will contact the office with bleeding.  CBC and chemistry panel reviewed.  Labs are adequate for treatment.  Mild thrombocytopenia as noted above.  Stable elevated creatinine.  He will return for follow-up and day 15 in 1 week.  We are available to see him sooner if needed.    Rodney Elliott ANP/GNP-BC   08/12/2024  9:35 AM

## 2024-08-12 NOTE — Patient Instructions (Signed)
 [facility] - A DEPT OF [facility]  Discharge Instructions: Thank you for choosing [facility] to provide your oncology and hematology care.   If you have a lab appointment with the Cancer Center, please go directly to the Cancer Center and check in at the registration area.   Wear comfortable clothing and clothing appropriate for easy access to any Portacath or PICC line.   We strive to give you quality time with your provider. You may need to reschedule your appointment if you arrive late (15 or more minutes).  Arriving late affects you and other patients whose appointments are after yours.  Also, if you miss three or more appointments without notifying the office, you may be dismissed from the clinic at the provider's discretion.      For prescription refill requests, have your pharmacy contact our office and allow 72 hours for refills to be completed.    Today you received the following chemotherapy and/or immunotherapy agents: velcade .      To help prevent nausea and vomiting after your treatment, we encourage you to take your nausea medication as directed.  BELOW ARE SYMPTOMS THAT SHOULD BE REPORTED IMMEDIATELY: *FEVER GREATER THAN 100.4 F (38 C) OR HIGHER *CHILLS OR SWEATING *NAUSEA AND VOMITING THAT IS NOT CONTROLLED WITH YOUR NAUSEA MEDICATION *UNUSUAL SHORTNESS OF BREATH *UNUSUAL BRUISING OR BLEEDING *URINARY PROBLEMS (pain or burning when urinating, or frequent urination) *BOWEL PROBLEMS (unusual diarrhea, constipation, pain near the anus) TENDERNESS IN MOUTH AND THROAT WITH OR WITHOUT PRESENCE OF ULCERS (sore throat, sores in mouth, or a toothache) UNUSUAL RASH, SWELLING OR PAIN  UNUSUAL VAGINAL DISCHARGE OR ITCHING   Items with * indicate a potential emergency and should be followed up as soon as possible or go to the Emergency Department if any problems should occur.  Please show the CHEMOTHERAPY ALERT CARD or IMMUNOTHERAPY  ALERT CARD at check-in to the Emergency Department and triage nurse.  Should you have questions after your visit or need to cancel or reschedule your appointment, please contact [facility]  Dept: [phone]  and follow the prompts.  Office hours are 8:00 a.m. to 4:30 p.m. Monday - Friday. Please note that voicemails left after 4:00 p.m. may not be returned until the following business day.  We are closed weekends and major holidays. You have access to a nurse at all times for urgent questions. Please call the main number to the clinic Dept: [phone] and follow the prompts.   For any non-urgent questions, you may also contact your provider using MyChart. We now offer e-Visits for anyone 27 and older to request care online for non-urgent symptoms. For details visit [email or web address].   Also download the MyChart app! Go to the app store, search MyChart, open the app, select [facility], and log in with your MyChart username and password.

## 2024-08-19 ENCOUNTER — Inpatient Hospital Stay

## 2024-08-19 ENCOUNTER — Inpatient Hospital Stay: Admitting: Nurse Practitioner

## 2024-08-19 ENCOUNTER — Encounter: Payer: Self-pay | Admitting: Nurse Practitioner

## 2024-08-19 VITALS — BP 142/74 | HR 66 | Temp 98.1°F | Resp 18 | Ht 68.0 in | Wt 128.7 lb

## 2024-08-19 DIAGNOSIS — D472 Monoclonal gammopathy: Secondary | ICD-10-CM

## 2024-08-19 DIAGNOSIS — Z5112 Encounter for antineoplastic immunotherapy: Secondary | ICD-10-CM | POA: Diagnosis not present

## 2024-08-19 LAB — CMP (CANCER CENTER ONLY)
ALT: 18 U/L (ref 0–44)
AST: 29 U/L (ref 15–41)
Albumin: 3.7 g/dL (ref 3.5–5.0)
Alkaline Phosphatase: 66 U/L (ref 38–126)
Anion gap: 7 (ref 5–15)
BUN: 27 mg/dL — ABNORMAL HIGH (ref 8–23)
CO2: 25 mmol/L (ref 22–32)
Calcium: 11.3 mg/dL — ABNORMAL HIGH (ref 8.9–10.3)
Chloride: 108 mmol/L (ref 98–111)
Creatinine: 2.4 mg/dL — ABNORMAL HIGH (ref 0.61–1.24)
GFR, Estimated: 24 mL/min — ABNORMAL LOW (ref >60)
Glucose, Bld: 144 mg/dL — ABNORMAL HIGH (ref 70–99)
Potassium: 4.7 mmol/L (ref 3.5–5.1)
Sodium: 139 mmol/L (ref 135–145)
Total Bilirubin: 1.2 mg/dL (ref 0.0–1.2)
Total Protein: 8.2 g/dL — ABNORMAL HIGH (ref 6.5–8.1)

## 2024-08-19 LAB — CBC WITH DIFFERENTIAL (CANCER CENTER ONLY)
Abs Immature Granulocytes: 0.01 K/uL (ref 0.00–0.07)
Basophils Absolute: 0 K/uL (ref 0.0–0.1)
Basophils Relative: 0 %
Eosinophils Absolute: 0.1 K/uL (ref 0.0–0.5)
Eosinophils Relative: 2 %
HCT: 25.3 % — ABNORMAL LOW (ref 39.0–52.0)
Hemoglobin: 9 g/dL — ABNORMAL LOW (ref 13.0–17.0)
Immature Granulocytes: 0 %
Lymphocytes Relative: 16 %
Lymphs Abs: 0.5 K/uL — ABNORMAL LOW (ref 0.7–4.0)
MCH: 38.6 pg — ABNORMAL HIGH (ref 26.0–34.0)
MCHC: 35.6 g/dL (ref 30.0–36.0)
MCV: 108.6 fL — ABNORMAL HIGH (ref 80.0–100.0)
Monocytes Absolute: 0.3 K/uL (ref 0.1–1.0)
Monocytes Relative: 10 %
Neutro Abs: 2.3 K/uL (ref 1.7–7.7)
Neutrophils Relative %: 72 %
Platelet Count: 70 K/uL — ABNORMAL LOW (ref 150–400)
RBC: 2.33 MIL/uL — ABNORMAL LOW (ref 4.22–5.81)
RDW: 12.4 % (ref 11.5–15.5)
WBC Count: 3.1 K/uL — ABNORMAL LOW (ref 4.0–10.5)
nRBC: 0 % (ref 0.0–0.2)

## 2024-08-19 MED ORDER — BORTEZOMIB CHEMO SQ INJECTION 3.5 MG (2.5MG/ML)
1.0000 mg/m2 | Freq: Once | INTRAMUSCULAR | Status: AC
  Administered 2024-08-19: 1.75 mg via SUBCUTANEOUS
  Filled 2024-08-19: qty 0.7

## 2024-08-19 MED ORDER — DEXAMETHASONE 4 MG PO TABS
20.0000 mg | ORAL_TABLET | Freq: Once | ORAL | Status: AC
  Administered 2024-08-19: 20 mg via ORAL
  Filled 2024-08-19: qty 5

## 2024-08-19 NOTE — Progress Notes (Signed)
 Patient seen by Olam Ned NP today  Vitals are within treatment parameters:Yes   Labs are within treatment parameters: No (Please specify and give further instructions.) creatinine 2.40 platelets 70 its ok to proceed  Treatment plan has been signed: Yes   Per physician team, Patient is ready for treatment. Please note the following modifications:dose reduced Velcade

## 2024-08-19 NOTE — Patient Instructions (Signed)
 [facility] - A DEPT OF [facility]  Discharge Instructions: Thank you for choosing [facility] to provide your oncology and hematology care.   If you have a lab appointment with the Cancer Center, please go directly to the Cancer Center and check in at the registration area.   Wear comfortable clothing and clothing appropriate for easy access to any Portacath or PICC line.   We strive to give you quality time with your provider. You may need to reschedule your appointment if you arrive late (15 or more minutes).  Arriving late affects you and other patients whose appointments are after yours.  Also, if you miss three or more appointments without notifying the office, you may be dismissed from the clinic at the provider's discretion.      For prescription refill requests, have your pharmacy contact our office and allow 72 hours for refills to be completed.    Today you received the following chemotherapy and/or immunotherapy agents: velcade .      To help prevent nausea and vomiting after your treatment, we encourage you to take your nausea medication as directed.  BELOW ARE SYMPTOMS THAT SHOULD BE REPORTED IMMEDIATELY: *FEVER GREATER THAN 100.4 F (38 C) OR HIGHER *CHILLS OR SWEATING *NAUSEA AND VOMITING THAT IS NOT CONTROLLED WITH YOUR NAUSEA MEDICATION *UNUSUAL SHORTNESS OF BREATH *UNUSUAL BRUISING OR BLEEDING *URINARY PROBLEMS (pain or burning when urinating, or frequent urination) *BOWEL PROBLEMS (unusual diarrhea, constipation, pain near the anus) TENDERNESS IN MOUTH AND THROAT WITH OR WITHOUT PRESENCE OF ULCERS (sore throat, sores in mouth, or a toothache) UNUSUAL RASH, SWELLING OR PAIN  UNUSUAL VAGINAL DISCHARGE OR ITCHING   Items with * indicate a potential emergency and should be followed up as soon as possible or go to the Emergency Department if any problems should occur.  Please show the CHEMOTHERAPY ALERT CARD or IMMUNOTHERAPY  ALERT CARD at check-in to the Emergency Department and triage nurse.  Should you have questions after your visit or need to cancel or reschedule your appointment, please contact [facility]  Dept: [phone]  and follow the prompts.  Office hours are 8:00 a.m. to 4:30 p.m. Monday - Friday. Please note that voicemails left after 4:00 p.m. may not be returned until the following business day.  We are closed weekends and major holidays. You have access to a nurse at all times for urgent questions. Please call the main number to the clinic Dept: [phone] and follow the prompts.   For any non-urgent questions, you may also contact your provider using MyChart. We now offer e-Visits for anyone 27 and older to request care online for non-urgent symptoms. For details visit [email or web address].   Also download the MyChart app! Go to the app store, search MyChart, open the app, select [facility], and log in with your MyChart username and password.

## 2024-08-19 NOTE — Progress Notes (Signed)
  [facility] OFFICE PROGRESS NOTE   Diagnosis: Smoldering myeloma  INTERVAL HISTORY:   Rodney Elliott returns as scheduled.  He began cycle 2 CyBorD 08/05/2024.  He denies nausea/vomiting.  No mouth sores.  No diarrhea.  No rash.  No numbness or tingling in the hands or feet.  He denies bleeding.  Objective:  Vital signs in last 24 hours:  Blood pressure (!) 142/74, pulse 66, temperature 98.1 F (36.7 C), temperature source Temporal, resp. rate 18, height 5' 8 (1.727 m), weight 128 lb 11.2 oz (58.4 kg), SpO2 100%.    HEENT: No thrush or ulcers. Resp: Lungs clear bilaterally. Cardio: Regular rate and rhythm. GI: No hepatosplenomegaly.  Nontender. Vascular: No leg edema.   Lab Results:  Lab Results  Component Value Date   WBC 3.1 (L) 08/19/2024   HGB 9.0 (L) 08/19/2024   HCT 25.3 (L) 08/19/2024   MCV 108.6 (H) 08/19/2024   PLT 70 (L) 08/19/2024   NEUTROABS 2.3 08/19/2024    Imaging:  No results found.  Medications: I have reviewed the patient's current medications.  Assessment/Plan: Macrocytic anemia   2. Renal insufficiency   3.   Serum monoclonal IgG kappa protein Bone survey 12/29/2020-subtle lytic lesions in the calvarium and proximal left femur Progressive elevation of free kappa light chains and increased kappa light chain proteinuria March 2022 Bone marrow biopsy 02/12/2021- 5- 7% plasma cells, kappa light chain restricted, 46 X,-Y karyotype Bone marrow biopsy 04/15/2024: Hypercellular marrow with 18% plasma cells by manual differential, 30% by CD138, kappa restricted, negative myeloma FISH panel Bone survey 04/27/2024-questionable single new small lucent lesion within the proximal left humerus.  Small lytic lesions in the proximal left femur and calvarium unchanged from 2022. 06/18/2024-IgG, kappa free light chains, M spike with progressive rise Acyclovir  prophylaxis  Cycle 1 day 1 CyBorD 07/07/2024 (Cytoxan 07/08/2024); day 8 07/13/2024; day 15  07/21/2024 Cycle 2 CyBorD 08/05/2024, day 8 08/12/2024, day 15 08/19/2024 (Velcade dose reduced due to thrombocytopenia) 08/05/2024 M spike, kappa free light chains, IgG increased   4.    Hypertension   5.    Idiopathic nonimmune hemolytic anemia/thrombocytopenia in 2014, spontaneously resolved   6.   Mild hypercalcemia  Disposition: Rodney Elliott appears stable.  He is completing cycle 2 CyBorD.  He is tolerating treatment well.  He has progressive thrombocytopenia on labs today.  Plan to proceed with day 15 as scheduled.  Velcade will be dose reduced.  He understands to contact the office with bleeding.  CBC and chemistry panel reviewed.  Labs are adequate for treatment.  Thrombocytopenia as noted above, Velcade dose reduced.  He will return for follow-up and cycle 3 CyBorD in 2 weeks.  We are available to see him sooner if needed.  Plan reviewed with Dr. Cloretta.    Olam Ned ANP/GNP-BC   08/19/2024  10:44 AM

## 2024-08-23 ENCOUNTER — Other Ambulatory Visit: Payer: Self-pay | Admitting: Oncology

## 2024-08-23 ENCOUNTER — Other Ambulatory Visit: Payer: Self-pay

## 2024-08-23 ENCOUNTER — Other Ambulatory Visit (HOSPITAL_COMMUNITY): Payer: Self-pay

## 2024-08-23 DIAGNOSIS — D472 Monoclonal gammopathy: Secondary | ICD-10-CM

## 2024-08-23 MED ORDER — CYCLOPHOSPHAMIDE 50 MG PO CAPS
175.0000 mg/m2 | ORAL_CAPSULE | ORAL | 0 refills | Status: DC
  Filled 2024-08-23: qty 18, 21d supply, fill #0
  Filled 2024-08-25: qty 18, 28d supply, fill #0

## 2024-08-24 ENCOUNTER — Other Ambulatory Visit: Payer: Self-pay

## 2024-08-25 ENCOUNTER — Other Ambulatory Visit: Payer: Self-pay | Admitting: Pharmacy Technician

## 2024-08-25 ENCOUNTER — Other Ambulatory Visit: Payer: Self-pay

## 2024-08-25 NOTE — Progress Notes (Signed)
 Specialty Pharmacy Refill Coordination Note  Rodney Elliott is a 89 y.o. male contacted today regarding refills of specialty medication(s) cycloPHOSphamide  (CYTOXAN )   Patient requested [name] at [facility] at [place] date: 08/27/24   Medication will be filled on: 08/26/24

## 2024-08-27 ENCOUNTER — Other Ambulatory Visit (HOSPITAL_COMMUNITY): Payer: Self-pay

## 2024-08-29 ENCOUNTER — Other Ambulatory Visit: Payer: Self-pay | Admitting: Oncology

## 2024-08-31 NOTE — Progress Notes (Unsigned)
 [facility]   Telephone:(336) [phone] Fax:(336) [phone]    Patient Care Team: Valentin Skates, DO as PCP - General (Internal Medicine)   CHIEF COMPLAINT: Follow up smoldering myeloma  CURRENT THERAPY: CyBorD  INTERVAL HISTORY Rodney Elliott returns for follow up and treatment as scheduled. He completed cycle 2. Feeling well in general. Energy/appetite are adequate. Manages constipation with miralax. Denies bone pain, mucositis, rash, neuropathy, fever/chills or other complaints.   ROS  All other systems reviewed and negative   Past Medical History:  Diagnosis Date   Asthma    Cancer (HCC)    Hypertension    Tubular adenoma of colon    2010     Past Surgical History:  Procedure Laterality Date   TONSILLECTOMY     TUMOR REMOVAL  1996   Mandible     Outpatient Encounter Medications as of 09/01/2024  Medication Sig Note   acyclovir  (ZOVIRAX ) 200 MG capsule Take 1 capsule (200 mg total) by mouth 2 (two) times daily.    ADVAIR DISKUS 100-50 MCG/DOSE AEPB Inhale 1 puff into the lungs daily.    albuterol (VENTOLIN HFA) 108 (90 Base) MCG/ACT inhaler Inhale 1 puff into the lungs every 4 (four) hours as needed.    amLODipine (NORVASC) 5 MG tablet Take 5 mg by mouth daily.    atorvastatin (LIPITOR) 20 MG tablet Take 20 mg by mouth daily.    Cholecalciferol (D3-1000) 25 MCG (1000 UT) capsule Take 1,000 Units by mouth daily.    cyclophosphamide  (CYTOXAN ) 50 MG capsule Take 6 capsules (300 mg total) by mouth once a week. Take weekly for 3 weeks, then hold for 1 week. Repeat every 4 weeks. Take at the [facility] when instructed by nurse. Start on 11/26 after MD appointment    fexofenadine (ALLEGRA) 180 MG tablet Take 180 mg by mouth daily. Alternates w/claritin    folic acid (FOLVITE) 1 MG tablet Take 1 mg by mouth daily. 04/12/2016: Received from: External Pharmacy   irbesartan (AVAPRO) 150 MG tablet Take 150 mg by mouth daily.    levothyroxine (SYNTHROID) 50 MCG tablet  Take 50 mcg by mouth daily before breakfast.    loratadine (CLARITIN) 10 MG tablet Take 10 mg by mouth daily. 06/25/2022: Alternates with Allegra   metoprolol succinate (TOPROL-XL) 50 MG 24 hr tablet Take 50 mg by mouth daily. 04/12/2016: Received from: External Pharmacy   montelukast (SINGULAIR) 10 MG tablet Take 10 mg by mouth daily.    ondansetron  (ZOFRAN ) 4 MG tablet Take 1 tablet (4 mg total) by mouth every 8 (eight) hours as needed for nausea or vomiting.    polyethylene glycol (MIRALAX / GLYCOLAX) 17 g packet Take 17 g by mouth daily.    prochlorperazine  (COMPAZINE ) 5 MG tablet Take 1 tablet (5 mg total) by mouth every 6 (six) hours as needed for nausea or vomiting.    doxazosin (CARDURA) 8 MG tablet Take 8 mg by mouth daily. (Patient not taking: Reported on 09/01/2024) 04/12/2016: Received from: External Pharmacy   JANUVIA 25 MG tablet Take 25 mg by mouth daily. (Patient not taking: Reported on 09/01/2024)    No facility-administered encounter medications on file as of 09/01/2024.     Today's Vitals   09/01/24 1000 09/01/24 1038  BP:  137/71  Pulse:  85  Resp:  16  Temp:  98 F (36.7 C)  TempSrc:  Temporal  SpO2:  100%  Weight:  126 lb 11.2 oz (57.5 kg)  Height:  5' 8 (1.727 m)  PainSc: 0-No pain    Body mass index is 19.26 kg/m.   ECOG PERFORMANCE STATUS: 1 - Symptomatic but completely ambulatory  PHYSICAL EXAM GENERAL:alert, no distress and comfortable SKIN: no rash  EYES: sclera clear LUNGS: clear with normal breathing effort HEART: regular rate & rhythm, no lower extremity edema NEURO: alert & oriented x 3 with fluent speech   CBC    Latest Ref Rng & Units 09/01/2024   10:20 AM 08/19/2024   10:00 AM 08/12/2024    9:20 AM  CBC  WBC 4.0 - 10.5 K/uL 2.9  3.1  2.6   Hemoglobin 13.0 - 17.0 g/dL 8.4  9.0  9.3   Hematocrit 39.0 - 52.0 % 23.9  25.3  26.3   Platelets 150 - 400 K/uL 129  70  87       CMP     Latest Ref Rng & Units 09/01/2024   10:20 AM 08/19/2024    10:00 AM 08/12/2024    9:20 AM  CMP  Glucose 70 - 99 mg/dL 864  855  854   BUN 8 - 23 mg/dL 26  27  33   Creatinine 0.61 - 1.24 mg/dL 7.67  7.59  7.66   Sodium 135 - 145 mmol/L 137  139  138   Potassium 3.5 - 5.1 mmol/L 4.2  4.7  4.5   Chloride 98 - 111 mmol/L 104  108  105   CO2 22 - 32 mmol/L 26  25  25    Calcium 8.9 - 10.3 mg/dL 89.0  88.6  88.8   Total Protein 6.5 - 8.1 g/dL 8.4  8.2  8.2   Total Bilirubin 0.0 - 1.2 mg/dL 1.0  1.2  0.8   Alkaline Phos 38 - 126 U/L 63  66  68   AST 15 - 41 U/L 33  29  29   ALT 0 - 44 U/L 22  18  19        ASSESSMENT & PLAN:  Macrocytic anemia   2. Renal insufficiency   3.   Serum monoclonal IgG kappa protein Bone survey 12/29/2020-subtle lytic lesions in the calvarium and proximal left femur Progressive elevation of free kappa light chains and increased kappa light chain proteinuria March 2022 Bone marrow biopsy 02/12/2021- 5- 7% plasma cells, kappa light chain restricted, 46 X,-Y karyotype Bone marrow biopsy 04/15/2024: Hypercellular marrow with 18% plasma cells by manual differential, 30% by CD138, kappa restricted, negative myeloma FISH panel Bone survey 04/27/2024-questionable single new small lucent lesion within the proximal left humerus.  Small lytic lesions in the proximal left femur and calvarium unchanged from 2022. 06/18/2024-IgG, kappa free light chains, M spike with progressive rise Acyclovir  prophylaxis  Cycle 1 day 1 CyBorD 07/07/2024 (Cytoxan  07/08/2024); day 8 07/13/2024; day 15 07/21/2024 Cycle 2 CyBorD 08/05/2024, day 8 08/12/2024, day 15 08/19/2024 (Velcade  dose reduced due to thrombocytopenia) 08/05/2024 M spike, kappa free light chains, IgG increased Cycle 3 CyBorD 09/01/2024   4.    Hypertension   5.    Idiopathic nonimmune hemolytic anemia/thrombocytopenia in 2014, spontaneously resolved   6.   Mild hypercalcemia     Disposition:  Rodney Elliott appears stable, s/p cycle 2 CyBorD, tolerating well overall. Side effects are  adequately managed with supportive care at home.  Able to recover and function with adequate performance status.  There is no clinical evidence of disease progression.  Labs reviewed; neutropenia and anemia are stable.  The platelet count improved after cycle  2.  We will follow-up the pending myeloma panel from today.   Proceed with cycle 3-day 1 CyBorD today with the current Velcade  dose reduction.  Follow up and cycle 3-day 8 on 12/4 as scheduled, or sooner if needed.     All questions were answered. The patient knows to call the clinic with any problems, questions or concerns. No barriers to learning were detected.  Angelique Chevalier K Sarabelle Genson, NP 09/01/2024

## 2024-09-01 ENCOUNTER — Inpatient Hospital Stay

## 2024-09-01 ENCOUNTER — Inpatient Hospital Stay (HOSPITAL_BASED_OUTPATIENT_CLINIC_OR_DEPARTMENT_OTHER): Admitting: Nurse Practitioner

## 2024-09-01 ENCOUNTER — Encounter: Payer: Self-pay | Admitting: Nurse Practitioner

## 2024-09-01 VITALS — BP 152/67 | HR 77 | Temp 98.6°F | Resp 16

## 2024-09-01 VITALS — BP 137/71 | HR 85 | Temp 98.0°F | Resp 16 | Ht 68.0 in | Wt 126.7 lb

## 2024-09-01 DIAGNOSIS — Z5112 Encounter for antineoplastic immunotherapy: Secondary | ICD-10-CM | POA: Diagnosis not present

## 2024-09-01 DIAGNOSIS — D472 Monoclonal gammopathy: Secondary | ICD-10-CM

## 2024-09-01 LAB — CMP (CANCER CENTER ONLY)
ALT: 22 U/L (ref 0–44)
AST: 33 U/L (ref 15–41)
Albumin: 3.6 g/dL (ref 3.5–5.0)
Alkaline Phosphatase: 63 U/L (ref 38–126)
Anion gap: 7 (ref 5–15)
BUN: 26 mg/dL — ABNORMAL HIGH (ref 8–23)
CO2: 26 mmol/L (ref 22–32)
Calcium: 10.9 mg/dL — ABNORMAL HIGH (ref 8.9–10.3)
Chloride: 104 mmol/L (ref 98–111)
Creatinine: 2.32 mg/dL — ABNORMAL HIGH (ref 0.61–1.24)
GFR, Estimated: 25 mL/min — ABNORMAL LOW (ref >60)
Glucose, Bld: 135 mg/dL — ABNORMAL HIGH (ref 70–99)
Potassium: 4.2 mmol/L (ref 3.5–5.1)
Sodium: 137 mmol/L (ref 135–145)
Total Bilirubin: 1 mg/dL (ref 0.0–1.2)
Total Protein: 8.4 g/dL — ABNORMAL HIGH (ref 6.5–8.1)

## 2024-09-01 LAB — CBC WITH DIFFERENTIAL (CANCER CENTER ONLY)
Abs Immature Granulocytes: 0.01 K/uL (ref 0.00–0.07)
Basophils Absolute: 0 K/uL (ref 0.0–0.1)
Basophils Relative: 0 %
Eosinophils Absolute: 0.1 K/uL (ref 0.0–0.5)
Eosinophils Relative: 2 %
HCT: 23.9 % — ABNORMAL LOW (ref 39.0–52.0)
Hemoglobin: 8.4 g/dL — ABNORMAL LOW (ref 13.0–17.0)
Immature Granulocytes: 0 %
Lymphocytes Relative: 22 %
Lymphs Abs: 0.7 K/uL (ref 0.7–4.0)
MCH: 38 pg — ABNORMAL HIGH (ref 26.0–34.0)
MCHC: 35.1 g/dL (ref 30.0–36.0)
MCV: 108.1 fL — ABNORMAL HIGH (ref 80.0–100.0)
Monocytes Absolute: 0.2 K/uL (ref 0.1–1.0)
Monocytes Relative: 8 %
Neutro Abs: 2 K/uL (ref 1.7–7.7)
Neutrophils Relative %: 68 %
Platelet Count: 129 K/uL — ABNORMAL LOW (ref 150–400)
RBC: 2.21 MIL/uL — ABNORMAL LOW (ref 4.22–5.81)
RDW: 12.7 % (ref 11.5–15.5)
WBC Count: 2.9 K/uL — ABNORMAL LOW (ref 4.0–10.5)
nRBC: 0 % (ref 0.0–0.2)

## 2024-09-01 MED ORDER — BORTEZOMIB CHEMO SQ INJECTION 3.5 MG (2.5MG/ML)
1.0000 mg/m2 | Freq: Once | INTRAMUSCULAR | Status: AC
  Administered 2024-09-01: 1.75 mg via SUBCUTANEOUS
  Filled 2024-09-01: qty 0.7

## 2024-09-01 MED ORDER — DEXAMETHASONE 4 MG PO TABS
20.0000 mg | ORAL_TABLET | Freq: Once | ORAL | Status: AC
  Administered 2024-09-01: 20 mg via ORAL
  Filled 2024-09-01: qty 5

## 2024-09-01 NOTE — Progress Notes (Signed)
 Patient seen by Lacie Burton, NP today  Vitals are within treatment parameters:Yes   Labs are within treatment parameters: Yes WBC 2.9 Creatinine 2.32  Treatment plan has been signed: Yes   Per physician team, Patient is ready for treatment and there are NO modifications to the treatment plan.

## 2024-09-01 NOTE — Patient Instructions (Signed)
 [facility] - A DEPT OF [facility]  Discharge Instructions: Thank you for choosing [facility] to provide your oncology and hematology care.   If you have a lab appointment with the Cancer Center, please go directly to the Cancer Center and check in at the registration area.   Wear comfortable clothing and clothing appropriate for easy access to any Portacath or PICC line.   We strive to give you quality time with your provider. You may need to reschedule your appointment if you arrive late (15 or more minutes).  Arriving late affects you and other patients whose appointments are after yours.  Also, if you miss three or more appointments without notifying the office, you may be dismissed from the clinic at the provider's discretion.      For prescription refill requests, have your pharmacy contact our office and allow 72 hours for refills to be completed.    Today you received the following chemotherapy and/or immunotherapy agents: velcade .      To help prevent nausea and vomiting after your treatment, we encourage you to take your nausea medication as directed.  BELOW ARE SYMPTOMS THAT SHOULD BE REPORTED IMMEDIATELY: *FEVER GREATER THAN 100.4 F (38 C) OR HIGHER *CHILLS OR SWEATING *NAUSEA AND VOMITING THAT IS NOT CONTROLLED WITH YOUR NAUSEA MEDICATION *UNUSUAL SHORTNESS OF BREATH *UNUSUAL BRUISING OR BLEEDING *URINARY PROBLEMS (pain or burning when urinating, or frequent urination) *BOWEL PROBLEMS (unusual diarrhea, constipation, pain near the anus) TENDERNESS IN MOUTH AND THROAT WITH OR WITHOUT PRESENCE OF ULCERS (sore throat, sores in mouth, or a toothache) UNUSUAL RASH, SWELLING OR PAIN  UNUSUAL VAGINAL DISCHARGE OR ITCHING   Items with * indicate a potential emergency and should be followed up as soon as possible or go to the Emergency Department if any problems should occur.  Please show the CHEMOTHERAPY ALERT CARD or IMMUNOTHERAPY  ALERT CARD at check-in to the Emergency Department and triage nurse.  Should you have questions after your visit or need to cancel or reschedule your appointment, please contact [facility]  Dept: [phone]  and follow the prompts.  Office hours are 8:00 a.m. to 4:30 p.m. Monday - Friday. Please note that voicemails left after 4:00 p.m. may not be returned until the following business day.  We are closed weekends and major holidays. You have access to a nurse at all times for urgent questions. Please call the main number to the clinic Dept: [phone] and follow the prompts.   For any non-urgent questions, you may also contact your provider using MyChart. We now offer e-Visits for anyone 27 and older to request care online for non-urgent symptoms. For details visit [email or web address].   Also download the MyChart app! Go to the app store, search MyChart, open the app, select [facility], and log in with your MyChart username and password.

## 2024-09-02 LAB — KAPPA/LAMBDA LIGHT CHAINS
Kappa free light chain: 4883.1 mg/L — ABNORMAL HIGH (ref 3.3–19.4)
Kappa, lambda light chain ratio: 460.67 — ABNORMAL HIGH (ref 0.26–1.65)
Lambda free light chains: 10.6 mg/L (ref 5.7–26.3)

## 2024-09-02 LAB — IGG: IgG (Immunoglobin G), Serum: 3284 mg/dL — ABNORMAL HIGH (ref 603–1613)

## 2024-09-06 LAB — PROTEIN ELECTROPHORESIS, SERUM
A/G Ratio: 0.7 (ref 0.7–1.7)
Albumin ELP: 3.3 g/dL (ref 2.9–4.4)
Alpha-1-Globulin: 0.3 g/dL (ref 0.0–0.4)
Alpha-2-Globulin: 0.6 g/dL (ref 0.4–1.0)
Beta Globulin: 1 g/dL (ref 0.7–1.3)
Gamma Globulin: 2.7 g/dL — ABNORMAL HIGH (ref 0.4–1.8)
Globulin, Total: 4.6 g/dL — ABNORMAL HIGH (ref 2.2–3.9)
M-Spike, %: 2.5 g/dL — ABNORMAL HIGH
Total Protein ELP: 7.9 g/dL (ref 6.0–8.5)

## 2024-09-08 ENCOUNTER — Other Ambulatory Visit: Payer: Self-pay

## 2024-09-09 ENCOUNTER — Other Ambulatory Visit: Payer: Self-pay | Admitting: *Deleted

## 2024-09-09 ENCOUNTER — Telehealth: Payer: Self-pay | Admitting: Pharmacist

## 2024-09-09 ENCOUNTER — Telehealth: Payer: Self-pay | Admitting: Pharmacy Technician

## 2024-09-09 ENCOUNTER — Inpatient Hospital Stay: Admitting: Nurse Practitioner

## 2024-09-09 ENCOUNTER — Inpatient Hospital Stay: Attending: Oncology

## 2024-09-09 ENCOUNTER — Other Ambulatory Visit (HOSPITAL_COMMUNITY): Payer: Self-pay

## 2024-09-09 ENCOUNTER — Encounter: Payer: Self-pay | Admitting: Nurse Practitioner

## 2024-09-09 ENCOUNTER — Other Ambulatory Visit: Payer: Self-pay | Admitting: Pharmacy Technician

## 2024-09-09 VITALS — BP 152/83 | HR 100 | Temp 97.8°F | Resp 18 | Ht 68.0 in | Wt 126.4 lb

## 2024-09-09 DIAGNOSIS — D539 Nutritional anemia, unspecified: Secondary | ICD-10-CM | POA: Insufficient documentation

## 2024-09-09 DIAGNOSIS — N289 Disorder of kidney and ureter, unspecified: Secondary | ICD-10-CM | POA: Diagnosis not present

## 2024-09-09 DIAGNOSIS — Z79899 Other long term (current) drug therapy: Secondary | ICD-10-CM | POA: Insufficient documentation

## 2024-09-09 DIAGNOSIS — I10 Essential (primary) hypertension: Secondary | ICD-10-CM | POA: Diagnosis not present

## 2024-09-09 DIAGNOSIS — Z7982 Long term (current) use of aspirin: Secondary | ICD-10-CM | POA: Insufficient documentation

## 2024-09-09 DIAGNOSIS — D6959 Other secondary thrombocytopenia: Secondary | ICD-10-CM | POA: Diagnosis not present

## 2024-09-09 DIAGNOSIS — D472 Monoclonal gammopathy: Secondary | ICD-10-CM

## 2024-09-09 DIAGNOSIS — C9000 Multiple myeloma not having achieved remission: Secondary | ICD-10-CM | POA: Insufficient documentation

## 2024-09-09 LAB — CBC WITH DIFFERENTIAL (CANCER CENTER ONLY)
Abs Immature Granulocytes: 0.02 K/uL (ref 0.00–0.07)
Basophils Absolute: 0 K/uL (ref 0.0–0.1)
Basophils Relative: 0 %
Eosinophils Absolute: 0 K/uL (ref 0.0–0.5)
Eosinophils Relative: 1 %
HCT: 24.8 % — ABNORMAL LOW (ref 39.0–52.0)
Hemoglobin: 8.7 g/dL — ABNORMAL LOW (ref 13.0–17.0)
Immature Granulocytes: 1 %
Lymphocytes Relative: 24 %
Lymphs Abs: 0.8 K/uL (ref 0.7–4.0)
MCH: 38 pg — ABNORMAL HIGH (ref 26.0–34.0)
MCHC: 35.1 g/dL (ref 30.0–36.0)
MCV: 108.3 fL — ABNORMAL HIGH (ref 80.0–100.0)
Monocytes Absolute: 0.3 K/uL (ref 0.1–1.0)
Monocytes Relative: 11 %
Neutro Abs: 2 K/uL (ref 1.7–7.7)
Neutrophils Relative %: 63 %
Platelet Count: 109 K/uL — ABNORMAL LOW (ref 150–400)
RBC: 2.29 MIL/uL — ABNORMAL LOW (ref 4.22–5.81)
RDW: 13.1 % (ref 11.5–15.5)
WBC Count: 3.2 K/uL — ABNORMAL LOW (ref 4.0–10.5)
nRBC: 0 % (ref 0.0–0.2)

## 2024-09-09 LAB — CMP (CANCER CENTER ONLY)
ALT: 30 U/L (ref 0–44)
AST: 42 U/L — ABNORMAL HIGH (ref 15–41)
Albumin: 3.7 g/dL (ref 3.5–5.0)
Alkaline Phosphatase: 64 U/L (ref 38–126)
Anion gap: 8 (ref 5–15)
BUN: 18 mg/dL (ref 8–23)
CO2: 25 mmol/L (ref 22–32)
Calcium: 11.2 mg/dL — ABNORMAL HIGH (ref 8.9–10.3)
Chloride: 105 mmol/L (ref 98–111)
Creatinine: 2.05 mg/dL — ABNORMAL HIGH (ref 0.61–1.24)
GFR, Estimated: 29 mL/min — ABNORMAL LOW (ref >60)
Glucose, Bld: 154 mg/dL — ABNORMAL HIGH (ref 70–99)
Potassium: 4.3 mmol/L (ref 3.5–5.1)
Sodium: 138 mmol/L (ref 135–145)
Total Bilirubin: 1 mg/dL (ref 0.0–1.2)
Total Protein: 8.5 g/dL — ABNORMAL HIGH (ref 6.5–8.1)

## 2024-09-09 MED ORDER — BORTEZOMIB CHEMO SQ INJECTION 3.5 MG (2.5MG/ML)
1.0000 mg/m2 | Freq: Once | INTRAMUSCULAR | Status: DC
  Filled 2024-09-09 (×2): qty 0.7

## 2024-09-09 MED ORDER — DEXAMETHASONE 4 MG PO TABS: 20.0000 mg | ORAL_TABLET | Freq: Once | ORAL | Status: DC

## 2024-09-09 MED ORDER — LENALIDOMIDE 10 MG PO CAPS: 10.0000 mg | ORAL_CAPSULE | ORAL | 0 refills | Status: DC

## 2024-09-09 MED ORDER — DEXAMETHASONE 4 MG PO TABS: ORAL_TABLET | ORAL | 0 refills | Status: DC

## 2024-09-09 NOTE — Telephone Encounter (Signed)
 Oral Oncology Patient Advocate Encounter  Prior Authorization for lenalidomide has been approved.    PA# [id number] Effective dates: 09/09/2024 through 09/09/2025  Patients co-pay is $56.    Barbette ([name]) [name], CPhT  [facility], [name], [name] - Oral Chemotherapy Patient Advocate Specialist III Phone: [phone]  Fax: [phone]

## 2024-09-09 NOTE — Progress Notes (Signed)
 Oral Oncology Patient Advocate Encounter  Patient switching to revlimid.  Rodney Elliott ([name]) [name], CPhT  [facility], [name], [name] - Oral Chemotherapy Patient Advocate Specialist III Phone: [phone]  Fax: [phone]

## 2024-09-09 NOTE — Progress Notes (Signed)
 [facility] OFFICE PROGRESS NOTE   Diagnosis: Multiple myeloma  INTERVAL HISTORY:   Rodney Elliott returns as scheduled.  He began cycle 3 CyBorD 09/01/2024.  No complaints.  He denies nausea/vomiting.  No mouth sores.  No diarrhea.  He denies neuropathy symptoms.  Objective:  Vital signs in last 24 hours:  Blood pressure (!) 152/83, pulse 100, temperature 97.8 F (36.6 C), temperature source Temporal, resp. rate 18, height 5' 8 (1.727 m), weight 126 lb 6.4 oz (57.3 kg), SpO2 100%.    HEENT: No thrush or ulcers. Resp: Lungs clear bilaterally. Cardio: Regular rate and rhythm. GI: No hepatosplenomegaly. Vascular: No leg edema. Neuro: Alert and oriented. Skin: No rash.   Lab Results:  Lab Results  Component Value Date   WBC 3.2 (L) 09/09/2024   HGB 8.7 (L) 09/09/2024   HCT 24.8 (L) 09/09/2024   MCV 108.3 (H) 09/09/2024   PLT 109 (L) 09/09/2024   NEUTROABS 2.0 09/09/2024    Imaging:  No results found.  Medications: I have reviewed the patient's current medications.  Assessment/Plan: Macrocytic anemia   2. Renal insufficiency   3.   Serum monoclonal IgG kappa protein Bone survey 12/29/2020-subtle lytic lesions in the calvarium and proximal left femur Progressive elevation of free kappa light chains and increased kappa light chain proteinuria March 2022 Bone marrow biopsy 02/12/2021- 5- 7% plasma cells, kappa light chain restricted, 46 X,-Y karyotype Bone marrow biopsy 04/15/2024: Hypercellular marrow with 18% plasma cells by manual differential, 30% by CD138, kappa restricted, negative myeloma FISH panel Bone survey 04/27/2024-questionable single new small lucent lesion within the proximal left humerus.  Small lytic lesions in the proximal left femur and calvarium unchanged from 2022. 06/18/2024-IgG, kappa free light chains, M spike with progressive rise Acyclovir  prophylaxis  Cycle 1 day 1 CyBorD 07/07/2024 (Cytoxan  07/08/2024); day 8 07/13/2024; day 15  07/21/2024 Cycle 2 CyBorD 08/05/2024, day 8 08/12/2024, day 15 08/19/2024 (Velcade  dose reduced due to thrombocytopenia) 08/05/2024 M spike, kappa free light chains, IgG increased Cycle 3 CyBorD 09/01/2024 09/01/2024 M spike, kappa free light chains, IgG increased 09/09/2024 CyBorD discontinued Plan for Revlimid 10 mg every other day x 10 doses start date 09/13/2024, dexamethasone  20 mg weekly x 3 start date 09/13/2024, aspirin 81 mg daily   4.    Hypertension   5.    Idiopathic nonimmune hemolytic anemia/thrombocytopenia in 2014, spontaneously resolved   6.   Mild hypercalcemia    Disposition: Rodney Elliott appears stable.  He has completed 2+ cycles of CyBorD.  No improvement in myeloma labs.  He understands and agrees with Dr. Andriette recommendation to discontinue CyBorD and begin Revlimid/dexamethasone .  We reviewed potential side effects associated with Revlimid including bone marrow toxicity, diarrhea, nausea, rash, increased risk of blood clots.  He was provided with printed information.  He understands the rationale for taking aspirin 81 mg daily.  We reviewed dosing instructions for Revlimid based on kidney function with plan for 10 mg every other day for 10 doses.  He will take dexamethasone  20 mg weekly x 3.  Anticipate start date cycle 1 09/13/2024.  He agrees with this plan.  He will return for lab and follow-up on 09/27/2024.  We are available to see him sooner if needed.  Patient seen with Dr. Cloretta.  Olam Ned ANP/GNP-BC   09/09/2024  12:49 PM  This was a shared visit with Olam Ned.  Rodney Elliott has completed 2 cycles of CyBorD for treatment of multiple myeloma.  The myeloma  markers have increased.  He has tolerated the treatment well.  We discussed treatment options with Rodney Elliott.  I recommend a trial of Revlimid/Decadron .  We can consider adding daratumumab if he does not respond to this regimen.  We reviewed potential toxicities associated with Revlimid including the  chance of hematologic toxicity, infection, bleeding, and thromboembolic disease.  He agrees to proceed.  The Revlimid will be dose adjusted based on his renal function.  Arvella Hof, MD

## 2024-09-09 NOTE — Telephone Encounter (Signed)
 Clinical Pharmacist Practitioner Encounter   Received new prescription for Revlimid (lenalidomide) for the treatment of IgG kappa multiple myeloma in conjunction with dexamethasone , planned duration until disease progression or unacceptable toxicity.  Patient will be transitioning from treatment with CyBorD to Revlimid and dexamethasone .  CMP from 09/09/2024 assessed, SCr 2.05 and CrCl ~ 18. Patient will need his dose adjusted for renal impairment.  Will follow-up 1 dose once entered by MD.   Current medication list in Epic reviewed, no DDIs with lenalidomide identified.  Evaluated chart and no patient barriers to medication adherence identified.   Prescription has been e-scribed to the [facility] for benefits analysis and approval.  Oral Oncology Clinic will continue to follow for insurance authorization, copayment issues, initial counseling and start date.   Yazir Koerber N. Kree Armato, PharmD, BCOP, CPP Hematology/Oncology Clinical Pharmacist [facility] Oral Chemotherapy Navigation Clinic [phone]  09/09/2024 1:26 PM

## 2024-09-09 NOTE — Telephone Encounter (Signed)
 Oral Oncology Patient Advocate Encounter   New authorization   Received notification that prior authorization for lenalidomide is required.   PA submitted on CMM via Latent Key [id number] Status is pending     Rodney Elliott (Rodney) Chet Elliott, CPhT  [facility], Rodney Elliott, [name] - Oral Chemotherapy Patient Advocate Specialist III Phone: [phone]  Fax: [phone]

## 2024-09-09 NOTE — Progress Notes (Signed)
 Reviewed [facility] program with Dr. Neomi and paperwork to register w/program completed and faxed to [employer]. He is aware to wear condom if having sex w/male that can become pregnant, not to share his medication, keep away from children, do not donate sperm or blood. Gave him copy of registration form and sent original to be scanned.  When registered, per MD will start Revlimid 10 mg every other day x 10 doses to see how he tolerates.  Repeat cycle every 28 days

## 2024-09-09 NOTE — Progress Notes (Signed)
 E-scribed Revlimid to [facility] at direction of oral oncology team.

## 2024-09-16 ENCOUNTER — Inpatient Hospital Stay: Admitting: Oncology

## 2024-09-16 ENCOUNTER — Inpatient Hospital Stay

## 2024-09-17 ENCOUNTER — Telehealth: Payer: Self-pay | Admitting: Pharmacist

## 2024-09-17 DIAGNOSIS — D472 Monoclonal gammopathy: Secondary | ICD-10-CM

## 2024-09-17 NOTE — Patient Instructions (Signed)
 [facility] Of [place]   Thank you for choosing [facility] to provide your oncology/hematology care and for allowing us  to participate in your care today!  As a reminder, we spoke about the following today: Revlimid  (lenalidomide ) for the treatment of IgG kappa multiple myeloma in conjunction with dexamethasone , planned duration until disease progression or unacceptable toxicity.   Treatment goal: Control  Medication handout has been provided.   **For oral cancer medication prescription refill requests, contact your pharmacy and they will contact our office if needed. Allow 5-7 days for refills to be completed by your specialty pharmacy.    Cancer Center General Instructions:  If you have an appointment at the [facility], please go directly to the Cancer Center and check in at the registration area.  We strive to give you quality time with your provider. You may need to reschedule your appointment if you arrive late (15 or more minutes).  Arriving late affects you and other patients whose appointments are after yours.  Also, if you miss three or more appointments without notifying the office, you may be dismissed from the clinic at the provider's discretion.      BELOW ARE SYMPTOMS THAT SHOULD BE REPORTED IMMEDIATELY: *FEVER GREATER THAN 100.4 F (38 C) OR HIGHER *CHILLS OR SWEATING *NAUSEA AND VOMITING THAT IS NOT CONTROLLED WITH YOUR NAUSEA MEDICATION *UNUSUAL SHORTNESS OF BREATH *UNUSUAL BRUISING OR BLEEDING *URINARY PROBLEMS (pain or burning when urinating, or frequent urination) *BOWEL PROBLEMS (unusual diarrhea, constipation, pain near the anus) TENDERNESS IN MOUTH AND THROAT WITH OR WITHOUT PRESENCE OF ULCERS (sore throat, sores in mouth, or a toothache) UNUSUAL RASH, SWELLING OR PAIN  UNUSUAL VAGINAL DISCHARGE OR ITCHING   Items with * indicate a potential emergency and should be followed up as soon as possible or  go to the Emergency Department if any problems should occur.  Should you have questions after your visit or need to cancel or reschedule your appointment, please contact [facility] Of . [facility]  [phone] and follow the prompts.  Office hours are 8:00 a.m. to 4:30 p.m. Monday - Friday. Please note that voicemails left after 4:00 p.m. may not be returned until the following business day.  We are closed weekends and major holidays. You have access to a nurse at all times for urgent questions. Please call the main number to the clinic [phone] and follow the prompts.  For any non-urgent questions, you may also contact your provider using MyChart. We now offer e-Visits for anyone 46 and older to request care online for non-urgent symptoms. For details visit [email or web address].   Also download the MyChart app! Go to the app store, search MyChart, open the app, select [place], and log in with your MyChart username and password.

## 2024-09-17 NOTE — Progress Notes (Signed)
 Clinical Pharmacist Practitioner Encounter   Per patient, he has set-up with [facility] to delivery his medication to his local pharmacy so he can pick it up there on 09/21/24. He plans to start that day.  Patient Education I spoke with patient for overview of new oral chemotherapy medication: Revlimid  (lenalidomide ) for the treatment of IgG kappa multiple myeloma in conjunction with dexamethasone , planned duration until disease progression or unacceptable toxicity.   Treatment goal: Control  Counseled patient on administration, dosing, side effects, monitoring, drug-food interactions, safe handling, storage, and disposal. Patient will take: Lenalidomide : Take 1 capsule (10 mg total) by mouth every other day. Take one tablet (10 mg ) by mouth EVERY OTHER DAY x 10 DOSES. Repeat every 28 days  Dexamethasone : Take 20 mg daily once a week for 3 weeks  Aspirin: Take 81mg  daily  Side effects include but not limited to: rash/itchy skin, N/V, fatigue, decreased wbc/hgb/plt, constipation or diarrhea Rash: Patient knows to call the office if rash occurs Diarrhea: patient knows to use loperamide as needed and call the office if they are having four or more loose stool per day  Reviewed with patient importance of keeping a medication schedule and plan for any missed doses.  After discussion with patient no patient barriers to medication adherence identified.   Distress evaluation: Distress thermometer not completed during telephone call as patient has been on previous lines of therapy.   Communication and Learning Assessment Primary learner: patient  Barriers to learning: No barriers Preferred language: English Learning preferences: Listening Reading   Rodney Elliott voiced understanding and appreciation. All questions answered. Medication handout provided.  Provided patient with Oral Chemotherapy Navigation Clinic phone number. Patient knows to call the office with questions or concerns.  Reviewed with patient the expectations for rescheduling or cancelling appointments.  Oral Chemotherapy Navigation Clinic will continue to follow.  Rodney Elliott, PharmD, BCOP, CPP Hematology/Oncology Clinical Pharmacist [facility] Oral Chemotherapy Navigation Clinic [phone]  09/17/2024 1:39 PM

## 2024-09-21 ENCOUNTER — Telehealth: Payer: Self-pay | Admitting: Pharmacy Technician

## 2024-09-21 NOTE — Telephone Encounter (Signed)
 Oral Oncology Patient Advocate Encounter  [facility] initial [facility] of [facility]alidomide  was shipped on 09/20/2024.  Shipment is scheduled to arrive to patient by 09/21/2024. [facility]  Naylah Cork ([name]) [name], CPhT  [facility], [name], [name] - Oral Chemotherapy Patient Advocate Specialist III Phone: [phone]  Fax: [phone]

## 2024-09-24 ENCOUNTER — Other Ambulatory Visit: Payer: Self-pay

## 2024-09-27 ENCOUNTER — Inpatient Hospital Stay

## 2024-09-27 ENCOUNTER — Inpatient Hospital Stay: Admitting: Nurse Practitioner

## 2024-09-27 ENCOUNTER — Encounter: Payer: Self-pay | Admitting: Nurse Practitioner

## 2024-09-27 VITALS — BP 123/80 | HR 100 | Temp 98.2°F | Resp 18 | Ht 68.0 in | Wt 125.6 lb

## 2024-09-27 DIAGNOSIS — D472 Monoclonal gammopathy: Secondary | ICD-10-CM

## 2024-09-27 DIAGNOSIS — C9000 Multiple myeloma not having achieved remission: Secondary | ICD-10-CM | POA: Diagnosis not present

## 2024-09-27 LAB — CBC WITH DIFFERENTIAL (CANCER CENTER ONLY)
Abs Immature Granulocytes: 0.01 K/uL (ref 0.00–0.07)
Basophils Absolute: 0 K/uL (ref 0.0–0.1)
Basophils Relative: 1 %
Eosinophils Absolute: 0.1 K/uL (ref 0.0–0.5)
Eosinophils Relative: 4 %
HCT: 25.5 % — ABNORMAL LOW (ref 39.0–52.0)
Hemoglobin: 8.9 g/dL — ABNORMAL LOW (ref 13.0–17.0)
Immature Granulocytes: 0 %
Lymphocytes Relative: 27 %
Lymphs Abs: 0.7 K/uL (ref 0.7–4.0)
MCH: 38.9 pg — ABNORMAL HIGH (ref 26.0–34.0)
MCHC: 34.9 g/dL (ref 30.0–36.0)
MCV: 111.4 fL — ABNORMAL HIGH (ref 80.0–100.0)
Monocytes Absolute: 0.2 K/uL (ref 0.1–1.0)
Monocytes Relative: 9 %
Neutro Abs: 1.5 K/uL — ABNORMAL LOW (ref 1.7–7.7)
Neutrophils Relative %: 59 %
Platelet Count: 98 K/uL — ABNORMAL LOW (ref 150–400)
RBC: 2.29 MIL/uL — ABNORMAL LOW (ref 4.22–5.81)
RDW: 13.5 % (ref 11.5–15.5)
WBC Count: 2.5 K/uL — ABNORMAL LOW (ref 4.0–10.5)
nRBC: 0 % (ref 0.0–0.2)

## 2024-09-27 LAB — CMP (CANCER CENTER ONLY)
ALT: 19 U/L (ref 0–44)
AST: 34 U/L (ref 15–41)
Albumin: 3.7 g/dL (ref 3.5–5.0)
Alkaline Phosphatase: 65 U/L (ref 38–126)
Anion gap: 7 (ref 5–15)
BUN: 23 mg/dL (ref 8–23)
CO2: 27 mmol/L (ref 22–32)
Calcium: 11.1 mg/dL — ABNORMAL HIGH (ref 8.9–10.3)
Chloride: 107 mmol/L (ref 98–111)
Creatinine: 2.13 mg/dL — ABNORMAL HIGH (ref 0.61–1.24)
GFR, Estimated: 28 mL/min — ABNORMAL LOW
Glucose, Bld: 234 mg/dL — ABNORMAL HIGH (ref 70–99)
Potassium: 4.6 mmol/L (ref 3.5–5.1)
Sodium: 141 mmol/L (ref 135–145)
Total Bilirubin: 0.8 mg/dL (ref 0.0–1.2)
Total Protein: 8.5 g/dL — ABNORMAL HIGH (ref 6.5–8.1)

## 2024-09-27 NOTE — Progress Notes (Signed)
" °  [facility] OFFICE PROGRESS NOTE   Diagnosis: Multiple myeloma,  INTERVAL HISTORY:   [name] returns as scheduled.  He has not started cycle 1 Revlimid /dexamethasone .  He plans to start tomorrow morning.  Overall feels well.  Good appetite.  He denies pain.  No nausea or vomiting.  No diarrhea.  No mouth sores.  No rash.  Objective:  Vital signs in last 24 hours:  Blood pressure 123/80, pulse 100, temperature 98.2 F (36.8 C), temperature source Temporal, resp. rate 18, height 5' 8 (1.727 m), weight 125 lb 9.6 oz (57 kg), SpO2 100%.    HEENT: No thrush or ulcers. Resp: Lungs clear bilaterally. Cardio: Regular rate and rhythm. GI: No hepatosplenomegaly. Vascular: No leg edema. Neuro: Alert and oriented.     Lab Results:  Lab Results  Component Value Date   WBC 2.5 (L) 09/27/2024   HGB 8.9 (L) 09/27/2024   HCT 25.5 (L) 09/27/2024   MCV 111.4 (H) 09/27/2024   PLT 98 (L) 09/27/2024   NEUTROABS 1.5 (L) 09/27/2024    Imaging:  No results found.  Medications: I have reviewed the patient's current medications.  Assessment/Plan: Macrocytic anemia   2. Renal insufficiency   3.   Serum monoclonal IgG kappa protein Bone survey 12/29/2020-subtle lytic lesions in the calvarium and proximal left femur Progressive elevation of free kappa light chains and increased kappa light chain proteinuria March 2022 Bone marrow biopsy 02/12/2021- 5- 7% plasma cells, kappa light chain restricted, 46 X,-Y karyotype Bone marrow biopsy 04/15/2024: Hypercellular marrow with 18% plasma cells by manual differential, 30% by CD138, kappa restricted, negative myeloma FISH panel Bone survey 04/27/2024-questionable single new small lucent lesion within the proximal left humerus.  Small lytic lesions in the proximal left femur and calvarium unchanged from 2022. 06/18/2024-IgG, kappa free light chains, M spike with progressive rise Acyclovir  prophylaxis  Cycle 1 day 1 CyBorD 07/07/2024  (Cytoxan  07/08/2024); day 8 07/13/2024; day 15 07/21/2024 Cycle 2 CyBorD 08/05/2024, day 8 08/12/2024, day 15 08/19/2024 (Velcade  dose reduced due to thrombocytopenia) 08/05/2024 M spike, kappa free light chains, IgG increased Cycle 3 CyBorD 09/01/2024 09/01/2024 M spike, kappa free light chains, IgG increased 09/09/2024 CyBorD discontinued Revlimid  10 mg every other day x 10 doses start date 09/28/2024, dexamethasone  20 mg weekly x 3 start date 09/28/2024, aspirin 81 mg daily   4.    Hypertension   5.    Idiopathic nonimmune hemolytic anemia/thrombocytopenia in 2014, spontaneously resolved   6.   Mild hypercalcemia  Disposition: [name] appears stable.  He will begin cycle 1 Revlimid /dexamethasone  09/28/2024.  He will return for a follow-up CBC on 10/08/2024.  We will see him in follow-up on 10/19/2024.  He will contact the office between appointments with any problems.    Olam Ned ANP/GNP-BC   09/27/2024  1:18 PM        "

## 2024-09-28 ENCOUNTER — Other Ambulatory Visit: Payer: Self-pay

## 2024-09-29 ENCOUNTER — Other Ambulatory Visit: Payer: Self-pay

## 2024-10-01 ENCOUNTER — Other Ambulatory Visit: Payer: Self-pay | Admitting: Nurse Practitioner

## 2024-10-01 DIAGNOSIS — D472 Monoclonal gammopathy: Secondary | ICD-10-CM

## 2024-10-03 ENCOUNTER — Other Ambulatory Visit: Payer: Self-pay | Admitting: Oncology

## 2024-10-04 ENCOUNTER — Inpatient Hospital Stay: Admitting: Nurse Practitioner

## 2024-10-04 ENCOUNTER — Inpatient Hospital Stay

## 2024-10-05 ENCOUNTER — Telehealth: Payer: Self-pay

## 2024-10-05 NOTE — Telephone Encounter (Signed)
 Returned patient's call regarding seeing blood when brushing teeth. Patient shared that he noted this twice yesterday and none today. He confirmed that he currently uses soft bristle toothbrush. Patient was advised that this is expected and to continue using soft bristle toothbrush and brush gently. He was also advised to call the clinic back on Friday if this reoccurs or gets worse and he verbalized understanding.

## 2024-10-08 ENCOUNTER — Telehealth: Payer: Self-pay

## 2024-10-08 ENCOUNTER — Inpatient Hospital Stay: Attending: Oncology

## 2024-10-08 DIAGNOSIS — C9000 Multiple myeloma not having achieved remission: Secondary | ICD-10-CM | POA: Diagnosis present

## 2024-10-08 DIAGNOSIS — D6959 Other secondary thrombocytopenia: Secondary | ICD-10-CM | POA: Insufficient documentation

## 2024-10-08 DIAGNOSIS — N289 Disorder of kidney and ureter, unspecified: Secondary | ICD-10-CM | POA: Insufficient documentation

## 2024-10-08 DIAGNOSIS — Z7982 Long term (current) use of aspirin: Secondary | ICD-10-CM | POA: Insufficient documentation

## 2024-10-08 DIAGNOSIS — Z7961 Long term (current) use of immunomodulator: Secondary | ICD-10-CM | POA: Diagnosis not present

## 2024-10-08 DIAGNOSIS — D472 Monoclonal gammopathy: Secondary | ICD-10-CM

## 2024-10-08 DIAGNOSIS — I10 Essential (primary) hypertension: Secondary | ICD-10-CM | POA: Diagnosis not present

## 2024-10-08 DIAGNOSIS — D539 Nutritional anemia, unspecified: Secondary | ICD-10-CM | POA: Insufficient documentation

## 2024-10-08 DIAGNOSIS — Z79899 Other long term (current) drug therapy: Secondary | ICD-10-CM | POA: Insufficient documentation

## 2024-10-08 DIAGNOSIS — Z7952 Long term (current) use of systemic steroids: Secondary | ICD-10-CM | POA: Insufficient documentation

## 2024-10-08 LAB — CBC WITH DIFFERENTIAL (CANCER CENTER ONLY)
Abs Immature Granulocytes: 0.06 K/uL (ref 0.00–0.07)
Basophils Absolute: 0 K/uL (ref 0.0–0.1)
Basophils Relative: 0 %
Eosinophils Absolute: 0.3 K/uL (ref 0.0–0.5)
Eosinophils Relative: 6 %
HCT: 26 % — ABNORMAL LOW (ref 39.0–52.0)
Hemoglobin: 9.1 g/dL — ABNORMAL LOW (ref 13.0–17.0)
Immature Granulocytes: 1 %
Lymphocytes Relative: 16 %
Lymphs Abs: 0.8 K/uL (ref 0.7–4.0)
MCH: 38.4 pg — ABNORMAL HIGH (ref 26.0–34.0)
MCHC: 35 g/dL (ref 30.0–36.0)
MCV: 109.7 fL — ABNORMAL HIGH (ref 80.0–100.0)
Monocytes Absolute: 0.4 K/uL (ref 0.1–1.0)
Monocytes Relative: 8 %
Neutro Abs: 3.3 K/uL (ref 1.7–7.7)
Neutrophils Relative %: 69 %
Platelet Count: 121 K/uL — ABNORMAL LOW (ref 150–400)
RBC: 2.37 MIL/uL — ABNORMAL LOW (ref 4.22–5.81)
RDW: 12.8 % (ref 11.5–15.5)
WBC Count: 4.8 K/uL (ref 4.0–10.5)
nRBC: 0 % (ref 0.0–0.2)

## 2024-10-08 NOTE — Telephone Encounter (Signed)
 Patient aware that his lab work was within parameters per recent Oncology visit and to attend his next scheduled appointment.

## 2024-10-11 ENCOUNTER — Other Ambulatory Visit: Payer: Self-pay | Admitting: Oncology

## 2024-10-11 NOTE — Telephone Encounter (Signed)
 1st cycle 12/23-1/10/26--next due 10/26/24--will refill at next OV on 10/19/24

## 2024-10-14 ENCOUNTER — Other Ambulatory Visit: Payer: Self-pay

## 2024-10-14 DIAGNOSIS — D649 Anemia, unspecified: Secondary | ICD-10-CM

## 2024-10-19 ENCOUNTER — Encounter: Payer: Self-pay | Admitting: Nurse Practitioner

## 2024-10-19 ENCOUNTER — Inpatient Hospital Stay

## 2024-10-19 ENCOUNTER — Inpatient Hospital Stay: Admitting: Nurse Practitioner

## 2024-10-19 VITALS — BP 125/72 | HR 100 | Temp 97.8°F | Resp 18 | Ht 68.0 in | Wt 119.6 lb

## 2024-10-19 DIAGNOSIS — D472 Monoclonal gammopathy: Secondary | ICD-10-CM | POA: Diagnosis not present

## 2024-10-19 DIAGNOSIS — C9000 Multiple myeloma not having achieved remission: Secondary | ICD-10-CM | POA: Diagnosis not present

## 2024-10-19 DIAGNOSIS — D649 Anemia, unspecified: Secondary | ICD-10-CM

## 2024-10-19 LAB — CBC WITH DIFFERENTIAL (CANCER CENTER ONLY)
Abs Immature Granulocytes: 0.01 K/uL (ref 0.00–0.07)
Basophils Absolute: 0 K/uL (ref 0.0–0.1)
Basophils Relative: 0 %
Eosinophils Absolute: 0.3 K/uL (ref 0.0–0.5)
Eosinophils Relative: 9 %
HCT: 23.6 % — ABNORMAL LOW (ref 39.0–52.0)
Hemoglobin: 8.2 g/dL — ABNORMAL LOW (ref 13.0–17.0)
Immature Granulocytes: 0 %
Lymphocytes Relative: 24 %
Lymphs Abs: 0.7 K/uL (ref 0.7–4.0)
MCH: 38.7 pg — ABNORMAL HIGH (ref 26.0–34.0)
MCHC: 34.7 g/dL (ref 30.0–36.0)
MCV: 111.3 fL — ABNORMAL HIGH (ref 80.0–100.0)
Monocytes Absolute: 0.3 K/uL (ref 0.1–1.0)
Monocytes Relative: 10 %
Neutro Abs: 1.7 K/uL (ref 1.7–7.7)
Neutrophils Relative %: 57 %
Platelet Count: 129 K/uL — ABNORMAL LOW (ref 150–400)
RBC: 2.12 MIL/uL — ABNORMAL LOW (ref 4.22–5.81)
RDW: 12.6 % (ref 11.5–15.5)
WBC Count: 3.1 K/uL — ABNORMAL LOW (ref 4.0–10.5)
nRBC: 0 % (ref 0.0–0.2)

## 2024-10-19 LAB — CMP (CANCER CENTER ONLY)
ALT: 31 U/L (ref 0–44)
AST: 29 U/L (ref 15–41)
Albumin: 3.6 g/dL (ref 3.5–5.0)
Alkaline Phosphatase: 81 U/L (ref 38–126)
Anion gap: 11 (ref 5–15)
BUN: 42 mg/dL — ABNORMAL HIGH (ref 8–23)
CO2: 26 mmol/L (ref 22–32)
Calcium: 11.9 mg/dL — ABNORMAL HIGH (ref 8.9–10.3)
Chloride: 106 mmol/L (ref 98–111)
Creatinine: 2.46 mg/dL — ABNORMAL HIGH (ref 0.61–1.24)
GFR, Estimated: 24 mL/min — ABNORMAL LOW
Glucose, Bld: 186 mg/dL — ABNORMAL HIGH (ref 70–99)
Potassium: 4.1 mmol/L (ref 3.5–5.1)
Sodium: 142 mmol/L (ref 135–145)
Total Bilirubin: 0.9 mg/dL (ref 0.0–1.2)
Total Protein: 7.2 g/dL (ref 6.5–8.1)

## 2024-10-19 MED ORDER — DEXAMETHASONE 4 MG PO TABS: ORAL_TABLET | ORAL | 0 refills | Status: DC

## 2024-10-19 NOTE — Progress Notes (Signed)
" °  [facility] OFFICE PROGRESS NOTE   Diagnosis: Multiple myeloma  INTERVAL HISTORY:   Mr. Meidinger appears stable.  He completed cycle 1 Revlimid /dexamethasone  beginning 09/28/2024.  He took the last dose of Revlimid  10/16/2024.  He denies nausea/vomiting.  No mouth sores.  No diarrhea.  No rash.  Objective:  Vital signs in last 24 hours:  Blood pressure 125/72, pulse 100, temperature 97.8 F (36.6 C), temperature source Temporal, resp. rate 18, height 5' 8 (1.727 m), weight 119 lb 9.6 oz (54.3 kg), SpO2 100%.    HEENT: No thrush or ulcers. Resp: Lungs clear bilaterally. Cardio: Regular rate and rhythm. GI: No hepatosplenomegaly. Vascular: No leg edema. Skin: No rash.   Lab Results:  Lab Results  Component Value Date   WBC 3.1 (L) 10/19/2024   HGB 8.2 (L) 10/19/2024   HCT 23.6 (L) 10/19/2024   MCV 111.3 (H) 10/19/2024   PLT 129 (L) 10/19/2024   NEUTROABS 1.7 10/19/2024    Imaging:  No results found.  Medications: I have reviewed the patient's current medications.  Assessment/Plan: Macrocytic anemia   2. Renal insufficiency   3.   Serum monoclonal IgG kappa protein Bone survey 12/29/2020-subtle lytic lesions in the calvarium and proximal left femur Progressive elevation of free kappa light chains and increased kappa light chain proteinuria March 2022 Bone marrow biopsy 02/12/2021- 5- 7% plasma cells, kappa light chain restricted, 46 X,-Y karyotype Bone marrow biopsy 04/15/2024: Hypercellular marrow with 18% plasma cells by manual differential, 30% by CD138, kappa restricted, negative myeloma FISH panel Bone survey 04/27/2024-questionable single new small lucent lesion within the proximal left humerus.  Small lytic lesions in the proximal left femur and calvarium unchanged from 2022. 06/18/2024-IgG, kappa free light chains, M spike with progressive rise Acyclovir  prophylaxis  Cycle 1 day 1 CyBorD 07/07/2024 (Cytoxan  07/08/2024); day 8 07/13/2024; day 15  07/21/2024 Cycle 2 CyBorD 08/05/2024, day 8 08/12/2024, day 15 08/19/2024 (Velcade  dose reduced due to thrombocytopenia) 08/05/2024 M spike, kappa free light chains, IgG increased Cycle 3 CyBorD 09/01/2024 09/01/2024 M spike, kappa free light chains, IgG increased 09/09/2024 CyBorD discontinued Revlimid  10 mg every other day x 10 doses start date 09/28/2024, dexamethasone  20 mg weekly x 3 start date 09/28/2024, aspirin 81 mg daily Anticipate cycle 2 Revlimid  10 mg every other day x 10 doses start date 10/26/2024, dexamethasone  20 mg weekly x 3 start date 10/26/2024, aspirin 81 mg daily   4.    Hypertension   5.    Idiopathic nonimmune hemolytic anemia/thrombocytopenia in 2014, spontaneously resolved   6.   Mild hypercalcemia    Disposition: Mr. Rufus appears stable.  He has completed 1 cycle of Revlimid /dexamethasone .  He tolerated well.  Anticipate cycle 2 Revlimid /dexamethasone  beginning 10/26/2024 (pending adequate CBC/chemistry panel same day).  CBC and chemistry panel from today reviewed.  Creatinine and calcium both slightly higher than recent baseline.  He will return for repeat labs in 1 week.  Next office visit in 1 month.  We are available to see him sooner if needed.    Olam Ned ANP/GNP-BC   10/19/2024  11:40 AM        "

## 2024-10-26 ENCOUNTER — Inpatient Hospital Stay

## 2024-10-26 DIAGNOSIS — C9000 Multiple myeloma not having achieved remission: Secondary | ICD-10-CM | POA: Diagnosis not present

## 2024-10-26 DIAGNOSIS — D472 Monoclonal gammopathy: Secondary | ICD-10-CM

## 2024-10-26 LAB — CBC WITH DIFFERENTIAL (CANCER CENTER ONLY)
Abs Immature Granulocytes: 0.01 K/uL (ref 0.00–0.07)
Basophils Absolute: 0 K/uL (ref 0.0–0.1)
Basophils Relative: 1 %
Eosinophils Absolute: 0.1 K/uL (ref 0.0–0.5)
Eosinophils Relative: 5 %
HCT: 23.8 % — ABNORMAL LOW (ref 39.0–52.0)
Hemoglobin: 8.2 g/dL — ABNORMAL LOW (ref 13.0–17.0)
Immature Granulocytes: 0 %
Lymphocytes Relative: 29 %
Lymphs Abs: 0.9 K/uL (ref 0.7–4.0)
MCH: 39.4 pg — ABNORMAL HIGH (ref 26.0–34.0)
MCHC: 34.5 g/dL (ref 30.0–36.0)
MCV: 114.4 fL — ABNORMAL HIGH (ref 80.0–100.0)
Monocytes Absolute: 0.3 K/uL (ref 0.1–1.0)
Monocytes Relative: 11 %
Neutro Abs: 1.7 K/uL (ref 1.7–7.7)
Neutrophils Relative %: 54 %
Platelet Count: 168 K/uL (ref 150–400)
RBC: 2.08 MIL/uL — ABNORMAL LOW (ref 4.22–5.81)
RDW: 14.4 % (ref 11.5–15.5)
WBC Count: 3.1 K/uL — ABNORMAL LOW (ref 4.0–10.5)
nRBC: 0 % (ref 0.0–0.2)

## 2024-10-26 LAB — CMP (CANCER CENTER ONLY)
ALT: 33 U/L (ref 0–44)
AST: 34 U/L (ref 15–41)
Albumin: 3.6 g/dL (ref 3.5–5.0)
Alkaline Phosphatase: 83 U/L (ref 38–126)
Anion gap: 12 (ref 5–15)
BUN: 32 mg/dL — ABNORMAL HIGH (ref 8–23)
CO2: 23 mmol/L (ref 22–32)
Calcium: 11.6 mg/dL — ABNORMAL HIGH (ref 8.9–10.3)
Chloride: 106 mmol/L (ref 98–111)
Creatinine: 2.32 mg/dL — ABNORMAL HIGH (ref 0.61–1.24)
GFR, Estimated: 25 mL/min — ABNORMAL LOW
Glucose, Bld: 221 mg/dL — ABNORMAL HIGH (ref 70–99)
Potassium: 4.3 mmol/L (ref 3.5–5.1)
Sodium: 141 mmol/L (ref 135–145)
Total Bilirubin: 0.7 mg/dL (ref 0.0–1.2)
Total Protein: 7.2 g/dL (ref 6.5–8.1)

## 2024-10-28 ENCOUNTER — Telehealth: Payer: Self-pay | Admitting: *Deleted

## 2024-10-28 NOTE — Telephone Encounter (Signed)
 Per Olam Ned, NP  Please let him know labs look okay to proceed with Revlimid /dexamethasone .  Follow-up as scheduled.

## 2024-11-02 ENCOUNTER — Telehealth: Payer: Self-pay | Admitting: *Deleted

## 2024-11-02 NOTE — Telephone Encounter (Signed)
 Rodney Elliott called to report the specialty pharmacy is having difficulty getting medications shipped and he wanted office aware. Requested he call when it is received and he starts taking it and he agrees.

## 2024-11-03 ENCOUNTER — Telehealth: Payer: Self-pay | Admitting: *Deleted

## 2024-11-03 DIAGNOSIS — D472 Monoclonal gammopathy: Secondary | ICD-10-CM

## 2024-11-03 MED ORDER — DEXAMETHASONE 4 MG PO TABS: ORAL_TABLET | ORAL | 0 refills | Status: AC

## 2024-11-03 NOTE — Telephone Encounter (Signed)
 Per Olam, NP: Needs to move his 2/16 lab/OV to week of 11/29/24 with Dr. Cloretta. Scheduling message sent.

## 2024-11-03 NOTE — Telephone Encounter (Signed)
 Rodney Elliott reports he was told to expect delivery of his lenalidomide  on 1/30 and he will start on 11/06/24. Current f/u appointment is 11/22/24. Refilled his dexamethasone  and he knows to start on day he begins lenalidomide .

## 2024-11-05 ENCOUNTER — Telehealth: Payer: Self-pay | Admitting: *Deleted

## 2024-11-05 ENCOUNTER — Telehealth: Payer: Self-pay | Admitting: Oncology

## 2024-11-05 NOTE — Telephone Encounter (Signed)
 Called PT to confirm rescheduled appt; day and time confirmed.

## 2024-11-05 NOTE — Telephone Encounter (Signed)
 Called to report he has not received his [facility]  yet [facility]. Will keep us  posted.

## 2024-11-12 ENCOUNTER — Telehealth: Payer: Self-pay | Admitting: *Deleted

## 2024-11-12 NOTE — Telephone Encounter (Addendum)
 Rodney Elliott finally received his lenalidomide  and will start taking today with last dose due on 11/30/24. Asking when he should be seen? Currently scheduled for 2/23. Notified Rodney Elliott that MD wants to keep him on schedule for 2/23

## 2024-11-22 ENCOUNTER — Inpatient Hospital Stay

## 2024-11-22 ENCOUNTER — Inpatient Hospital Stay: Admitting: Oncology

## 2024-11-29 ENCOUNTER — Inpatient Hospital Stay

## 2024-11-29 ENCOUNTER — Inpatient Hospital Stay: Admitting: Oncology
